# Patient Record
Sex: Female | Born: 1990 | Hispanic: Yes | Smoking: Never smoker
Health system: Southern US, Community
[De-identification: ages and names within clinical notes are randomized; demographics above are authoritative.]

## PROBLEM LIST (undated history)

## (undated) ENCOUNTER — Inpatient Hospital Stay: Payer: Self-pay

## (undated) DIAGNOSIS — G43909 Migraine, unspecified, not intractable, without status migrainosus: Secondary | ICD-10-CM

## (undated) DIAGNOSIS — F419 Anxiety disorder, unspecified: Secondary | ICD-10-CM

## (undated) DIAGNOSIS — O99210 Obesity complicating pregnancy, unspecified trimester: Secondary | ICD-10-CM

## (undated) DIAGNOSIS — J45909 Unspecified asthma, uncomplicated: Secondary | ICD-10-CM

## (undated) DIAGNOSIS — D649 Anemia, unspecified: Secondary | ICD-10-CM

## (undated) DIAGNOSIS — K219 Gastro-esophageal reflux disease without esophagitis: Secondary | ICD-10-CM

## (undated) HISTORY — PX: CHOLECYSTECTOMY: SHX55

## (undated) HISTORY — DX: Obesity complicating pregnancy, unspecified trimester: O99.210

## (undated) HISTORY — DX: Anxiety disorder, unspecified: F41.9

---

## 2012-04-03 DIAGNOSIS — O4290 Premature rupture of membranes, unspecified as to length of time between rupture and onset of labor, unspecified weeks of gestation: Secondary | ICD-10-CM

## 2013-11-05 ENCOUNTER — Emergency Department: Payer: Self-pay | Admitting: Emergency Medicine

## 2013-11-05 LAB — URINALYSIS, COMPLETE
BILIRUBIN, UR: NEGATIVE
Bacteria: NONE SEEN
GLUCOSE, UR: NEGATIVE mg/dL (ref 0–75)
KETONE: NEGATIVE
Nitrite: NEGATIVE
PH: 6 (ref 4.5–8.0)
Protein: NEGATIVE
SPECIFIC GRAVITY: 1.015 (ref 1.003–1.030)
Squamous Epithelial: 4
WBC UR: 4 /HPF (ref 0–5)

## 2013-11-05 LAB — COMPREHENSIVE METABOLIC PANEL
Albumin: 3.4 g/dL (ref 3.4–5.0)
Alkaline Phosphatase: 108 U/L
Anion Gap: 5 — ABNORMAL LOW (ref 7–16)
BUN: 14 mg/dL (ref 7–18)
Bilirubin,Total: 0.2 mg/dL (ref 0.2–1.0)
Calcium, Total: 9.2 mg/dL (ref 8.5–10.1)
Chloride: 107 mmol/L (ref 98–107)
Co2: 26 mmol/L (ref 21–32)
Creatinine: 0.95 mg/dL (ref 0.60–1.30)
EGFR (Non-African Amer.): >60
Glucose: 95 mg/dL (ref 65–99)
OSMOLALITY: 276 (ref 275–301)
POTASSIUM: 3.7 mmol/L (ref 3.5–5.1)
SGOT(AST): 22 U/L (ref 15–37)
SGPT (ALT): 29 U/L (ref 12–78)
Sodium: 138 mmol/L (ref 136–145)
TOTAL PROTEIN: 7.9 g/dL (ref 6.4–8.2)

## 2013-11-05 LAB — CBC
HCT: 37.5 % (ref 35.0–47.0)
HGB: 12.6 g/dL (ref 12.0–16.0)
MCH: 27.8 pg (ref 26.0–34.0)
MCHC: 33.7 g/dL (ref 32.0–36.0)
MCV: 83 fL (ref 80–100)
Platelet: 382 10*3/uL (ref 150–440)
RBC: 4.54 10*6/uL (ref 3.80–5.20)
RDW: 14.6 % — AB (ref 11.5–14.5)
WBC: 10.3 10*3/uL (ref 3.6–11.0)

## 2013-11-05 LAB — LIPASE, BLOOD: Lipase: 110 U/L (ref 73–393)

## 2013-11-05 LAB — HCG, QUANTITATIVE, PREGNANCY: Beta Hcg, Quant.: <1 m[IU]/mL — ABNORMAL LOW

## 2015-11-30 ENCOUNTER — Emergency Department
Admission: EM | Admit: 2015-11-30 | Discharge: 2015-11-30 | Disposition: A | Discharge status: Home / Self Care | Payer: Medicaid Other | Attending: Emergency Medicine | Admitting: Emergency Medicine

## 2015-11-30 DIAGNOSIS — R300 Dysuria: Secondary | ICD-10-CM | POA: Diagnosis not present

## 2015-11-30 DIAGNOSIS — R1031 Right lower quadrant pain: Secondary | ICD-10-CM | POA: Insufficient documentation

## 2015-11-30 LAB — URINALYSIS COMPLETE WITH MICROSCOPIC (ARMC ONLY)
Bilirubin Urine: NEGATIVE
Glucose, UA: NEGATIVE mg/dL
Ketones, ur: NEGATIVE mg/dL
Leukocytes, UA: NEGATIVE
Nitrite: NEGATIVE
PH: 5 (ref 5.0–8.0)
PROTEIN: NEGATIVE mg/dL
Specific Gravity, Urine: 1.019 (ref 1.005–1.030)

## 2015-11-30 LAB — POCT PREGNANCY, URINE: Preg Test, Ur: NEGATIVE

## 2015-11-30 LAB — PREGNANCY, URINE: Preg Test, Ur: NEGATIVE

## 2015-11-30 MED ORDER — PHENAZOPYRIDINE HCL 95 MG PO TABS: 95.0000 mg | ORAL_TABLET | Freq: Three times a day (TID) | ORAL | Status: DC | PRN

## 2015-11-30 MED ORDER — NAPROXEN 500 MG PO TABS: 500.0000 mg | ORAL_TABLET | Freq: Two times a day (BID) | ORAL | Status: DC

## 2015-11-30 NOTE — ED Notes (Signed)
Pt in with co suprapubic pain and urinary urgency and frequency.

## 2015-11-30 NOTE — Discharge Instructions (Signed)

## 2015-11-30 NOTE — ED Provider Notes (Signed)
[facility] Emergency Department Provider Note  ____________________________________________  Time seen: Approximately 7:39 PM  I have reviewed the triage vital signs and the nursing notes.   HISTORY  Chief Complaint Dysuria    HPI Candice Moore is a 25 y.o. female who presents emergency department complaining of right lower flank pain, suprapubic pain, dysuria, polyuria. Patient states that the symptoms have been intermittent in nature for the past 2 weeks and had increased in severity today. Patient denies any fevers or chills, nausea or vomiting, hematuria. Patient is not taking any medications prior to arrival. No history of kidney stones. No history of urinary tract infections.   No past medical history on file.  There are no active problems to display for this patient.   No past surgical history on file.  Current Outpatient Rx  Name  Route  Sig  Dispense  Refill  . naproxen (NAPROSYN) 500 MG tablet   Oral   Take 1 tablet (500 mg total) by mouth 2 (two) times daily with a meal.   60 tablet   0   . phenazopyridine (PYRIDIUM) 95 MG tablet   Oral   Take 1 tablet (95 mg total) by mouth 3 (three) times daily as needed for pain.   6 tablet   0     Allergies Review of patient's allergies indicates no known allergies.  No family history on file.  Social History Social History  Substance Use Topics  . Smoking status: Not on file  . Smokeless tobacco: Not on file  . Alcohol Use: Not on file     Review of Systems  Constitutional: No fever/chills Cardiovascular: no chest pain. Respiratory: no cough. No SOB. Gastrointestinal: No abdominal pain.  No nausea, no vomiting.  No diarrhea.  No constipation. Genitourinary: Positive for dysuria and polyuria.. No hematuria Musculoskeletal: Positive for right flank pain. Skin: Negative for rash. Neurological: Negative for headaches, focal weakness or numbness. 10-point ROS otherwise  negative.  ____________________________________________   PHYSICAL EXAM:  VITAL SIGNS: ED Triage Vitals  Enc Vitals Group     BP 11/30/15 1927 109/64 mmHg     Pulse Rate 11/30/15 1927 88     Resp 11/30/15 1927 18     Temp 11/30/15 1927 98.3 F (36.8 C)     Temp Source 11/30/15 1927 Oral     SpO2 11/30/15 1927 99 %     Weight 11/30/15 1927 224 lb (101.606 kg)     Height 11/30/15 1927  (1.575 m)     Head Cir --      Peak Flow --      Pain Score 11/30/15 1928 6     Pain Loc --      Pain Edu? --      Excl. in GC? --      Constitutional: Alert and oriented. Well appearing and in no acute distress. Eyes: Conjunctivae are normal. PERRL. EOMI. Head: Atraumatic. Cardiovascular: Normal rate, regular rhythm. Normal S1 and S2.  Good peripheral circulation. Respiratory: Normal respiratory effort without tachypnea or retractions. Lungs CTAB. Gastrointestinal: Bowel sounds 4 quadrants. Soft to palpation with no rigidity or guarding. Patient is tender to palpation in the suprapubic region but otherwise nontender to palpation.. No distention. No CVA tenderness. Neurologic:  Normal speech and language. No gross focal neurologic deficits are appreciated.  Skin:  Skin is warm, dry and intact. No rash noted. Psychiatric: Mood and affect are normal. Speech and behavior are normal. Patient exhibits appropriate insight and judgement.  ____________________________________________   LABS (all labs ordered are listed, but only abnormal results are displayed)  Labs Reviewed  URINALYSIS COMPLETEWITH MICROSCOPIC ([facility] ONLY) - Abnormal; Notable for the following:    Color, Urine YELLOW (*)    APPearance HAZY (*)    Hgb urine dipstick 1+ (*)    Bacteria, UA RARE (*)    Squamous Epithelial / LPF 0-5 (*)    All other components within normal limits  PREGNANCY, URINE  POCT PREGNANCY, URINE    ____________________________________________  EKG   ____________________________________________  RADIOLOGY   No results found.  ____________________________________________    PROCEDURES  Procedure(s) performed:       Medications - No data to display   ____________________________________________   INITIAL IMPRESSION / ASSESSMENT AND PLAN / ED COURSE  Pertinent labs & imaging results that were available during my care of the patient were reviewed by me and considered in my medical decision making (see chart for details).  Patient's diagnosis is consistent with dysuria. Upon further questioning patient states that she has just stopped the Mirena birth control method. She states that this would be the time that she should return to her periods. Patient states that the symptoms are similar to the last period that she had but just increased in nature. Urinalysis returns with no indication of urinary tract infection. There is minor hematuria noted. Patient is given the option to pursue CT scan at this time to evaluate for kidney stones. Patient declines at this time. Patient is given instructions to take prescribed medication as well as increased fluids to include cranberry juice for improvement of possible cystitis. Patient is also encouraged to use heating pad to relieve possible menstrual cramps. Patient will follow-up with specialist for any symptoms persisting past this treatment course.. Patient is given ED precautions to return to the ED for any worsening or new symptoms.     ____________________________________________  FINAL CLINICAL IMPRESSION(S) / ED DIAGNOSES  Final diagnoses:  Dysuria      NEW MEDICATIONS STARTED DURING THIS VISIT:  New Prescriptions   NAPROXEN (NAPROSYN) 500 MG TABLET    Take 1 tablet (500 mg total) by mouth 2 (two) times daily with a meal.   PHENAZOPYRIDINE (PYRIDIUM) 95 MG TABLET    Take 1 tablet (95 mg total) by mouth 3 (three)  times daily as needed for pain.        This chart was dictated using voice recognition software/Dragon. Despite best efforts to proofread, errors can occur which can change the meaning. Any change was purely unintentional.    Racheal PatchesJonathan D Yomar Mejorado, PA-C 11/30/15 2136  Jennye MoccasinBrian S Quigley, MD 11/30/15 2227

## 2016-04-04 ENCOUNTER — Other Ambulatory Visit: Payer: Self-pay | Admitting: Family Medicine

## 2016-04-04 DIAGNOSIS — R1030 Lower abdominal pain, unspecified: Secondary | ICD-10-CM

## 2016-04-04 DIAGNOSIS — Z3491 Encounter for supervision of normal pregnancy, unspecified, first trimester: Secondary | ICD-10-CM

## 2016-04-06 ENCOUNTER — Ambulatory Visit
Admission: RE | Admit: 2016-04-06 | Discharge: 2016-04-06 | Disposition: A | Discharge status: Home / Self Care | Payer: 59 | Source: Ambulatory Visit | Attending: Family Medicine | Admitting: Family Medicine

## 2016-04-06 DIAGNOSIS — Z3481 Encounter for supervision of other normal pregnancy, first trimester: Secondary | ICD-10-CM | POA: Diagnosis present

## 2016-04-06 DIAGNOSIS — Z3491 Encounter for supervision of normal pregnancy, unspecified, first trimester: Secondary | ICD-10-CM

## 2016-04-06 DIAGNOSIS — Z3A09 9 weeks gestation of pregnancy: Secondary | ICD-10-CM | POA: Insufficient documentation

## 2016-04-06 DIAGNOSIS — O3481 Maternal care for other abnormalities of pelvic organs, first trimester: Secondary | ICD-10-CM | POA: Diagnosis not present

## 2016-04-06 DIAGNOSIS — O26891 Other specified pregnancy related conditions, first trimester: Secondary | ICD-10-CM | POA: Diagnosis not present

## 2016-04-06 DIAGNOSIS — R1030 Lower abdominal pain, unspecified: Secondary | ICD-10-CM

## 2016-09-24 NOTE — L&D Delivery Note (Signed)
Operative Delivery Note At 1:31 PM a viable female was delivered via Vaginal, Spontaneous Delivery.  Presentation: vertex; Position: Occiput,, Anterior; Station: +5.  Delivery of the head: 11/02/2016  1:29 PM First maneuver: 11/02/2016  1:29 PM, McRoberts Second maneuver: 11/02/2016  1:30 PM, Suprapubic Pressure McRoberts Third maneuver: ,   Fourth maneuver: ,   Fifth maneuver: ,   Sixth maneuver: ,    Total time of dystocia was about 80 seconds by my count.   Verbal consent: obtained from patient.  APGAR: 6, 9; weight 9 lb 2 oz (4139 g).   Placenta status: delivered spontaneously, intact with 3VC.   Cord:  3VC with the following complications: .  Cord pH: not needed based an APGARs  Anesthesia:  None Episiotomy: None Lacerations: None Suture Repair: n/a Est. Blood Loss (mL): 350  Mom to postpartum.  Baby to Couplet care / Skin to Skin.  Called to see patient.  Mom pushed to deliver a viable female infant.  The head was somewhat delayed at delivery.  I performed a Ritgen maneuver to deliver the head.  The head rotated from OA to facing maternal right.  The usual, gentle traction was delivered to the fetal head without successful movement of the shoulders.  RN Chilton SiGreen and Ophelia CharterYates assisted with performing a McRoberts position, which did not affect delivery.  Suprapubic pressure was applied to the posterior right fetal shoulder.  With this maneuver the right shoulder delivered with gentle traction on the fetal head.  The posterior shoulder delivered easily.  The stomach was difficult to deliver, but with traction under the axillae, the fetal abdominal area was delivered and the rest of the body followed without issue.  The nursery staff was present at that time and the cord was quickly clamped and cut (allowed FOB to cut quickly).  Infant to peds staff.  Dr. Eric FormWimmer available immediately.  Infant with initial limited movement of right upper limb.  His APGARs were 6 and 9.  The nursery staff did not request  blood gases and they were not indicated from an obstetric standpoint.  Weight 4,139 grams. Placenta delivered spontaneously, intact, with a 3-vessel cord.  No vaginal, cervical, or perineal lacerations. All counts correct.  Hemostasis obtained with IV pitocin and fundal massage. EBL 350 mL.     Conard NovakJackson, Stephen D, MD 11/02/2016, 1:54 PM

## 2016-09-30 ENCOUNTER — Observation Stay
Admission: EM | Admit: 2016-09-30 | Discharge: 2016-09-30 | Disposition: A | Discharge status: Home / Self Care | Payer: 59 | Attending: Obstetrics and Gynecology | Admitting: Obstetrics and Gynecology

## 2016-09-30 ENCOUNTER — Encounter: Payer: Self-pay | Admitting: *Deleted

## 2016-09-30 DIAGNOSIS — N898 Other specified noninflammatory disorders of vagina: Secondary | ICD-10-CM | POA: Diagnosis present

## 2016-09-30 DIAGNOSIS — Z3A34 34 weeks gestation of pregnancy: Secondary | ICD-10-CM | POA: Diagnosis not present

## 2016-09-30 DIAGNOSIS — O26893 Other specified pregnancy related conditions, third trimester: Secondary | ICD-10-CM | POA: Diagnosis not present

## 2016-09-30 DIAGNOSIS — N949 Unspecified condition associated with female genital organs and menstrual cycle: Secondary | ICD-10-CM | POA: Diagnosis present

## 2016-09-30 HISTORY — DX: Gastro-esophageal reflux disease without esophagitis: K21.9

## 2016-09-30 NOTE — OB Triage Note (Signed)
Vaginal pain and pressure since Friday. Elaina HoopsElks, Lateef Juncaj S

## 2016-09-30 NOTE — Final Progress Note (Signed)
Physician Final Progress Note  Patient ID: Candice Moore MRN: [id number] DOB/AGE: 25/09/1990 25 y.o.  Admit date: 09/30/2016 Admitting provider: Vena AustriaAndreas Weyman Bogdon, MD Discharge date: 09/30/2016   Admission Diagnoses: Vaginal pressure  Discharge Diagnoses:  Active Problems:   Vaginal discomfort  26 yo G3P1011 at 358w2d presenting for vaginal pressure.  No contractions, no vaginal bleeding, +FM, no pain.  Denies dysuria  Consults: None  Significant Findings/ Diagnostic Studies: none  Procedures: NST 140, moderate variability, +accels, no decels.  Rare uterine contractins  Discharge Condition: good  Disposition: 01-Home or Self Care  Diet: Regular diet  Discharge Activity: Activity as tolerated  Discharge Instructions    Discharge activity:  No Restrictions    Complete by:  As directed    Fetal Kick Count:  Lie on our left side for one hour after a meal, and count the number of times your baby kicks.  If it is less than 5 times, get up, move around and drink some juice.  Repeat the test 30 minutes later.  If it is still less than 5 kicks in an hour, notify your doctor.    Complete by:  As directed    LABOR:  When conractions begin, you should start to time them from the beginning of one contraction to the beginning  of the next.  When contractions are 5 - 10 minutes apart or less and have been regular for at least an hour, you should call your health care provider.    Complete by:  As directed    No sexual activity restrictions    Complete by:  As directed    Notify physician for bleeding from the vagina    Complete by:  As directed    Notify physician for blurring of vision or spots before the eyes    Complete by:  As directed    Notify physician for chills or fever    Complete by:  As directed    Notify physician for fainting spells, "black outs" or loss of consciousness    Complete by:  As directed    Notify physician for increase in vaginal discharge    Complete by:  As  directed    Notify physician for leaking of fluid    Complete by:  As directed    Notify physician for pain or burning when urinating    Complete by:  As directed    Notify physician for pelvic pressure (sudden increase)    Complete by:  As directed    Notify physician for severe or continued nausea or vomiting    Complete by:  As directed    Notify physician for sudden gushing of fluid from the vagina (with or without continued leaking)    Complete by:  As directed    Notify physician for sudden, constant, or occasional abdominal pain    Complete by:  As directed    Notify physician if baby moving less than usual    Complete by:  As directed      Allergies as of 09/30/2016   No Known Allergies     Medication List    STOP taking these medications   phenazopyridine 95 MG tablet Commonly known as:  PYRIDIUM     TAKE these medications   multivitamin-prenatal 27-0.8 MG Tabs tablet Take 1 tablet by mouth daily at 12 noon.   ranitidine 150 MG capsule Commonly known as:  ZANTAC Take 150 mg by mouth 2 (two) times daily.  Total time spent taking care of this patient: 30 minutes  Signed: Lorrene Reid 09/30/2016, 7:10 PM

## 2016-10-30 ENCOUNTER — Inpatient Hospital Stay
Admission: EM | Admit: 2016-10-30 | Discharge: 2016-10-31 | Disposition: A | Discharge status: Home / Self Care | Payer: 59 | Source: Home / Self Care | Admitting: Obstetrics and Gynecology

## 2016-10-30 NOTE — OB Triage Note (Addendum)
Pt G3P1 7022w4d  complains of contractions every 5 minutes since 10/30/16 0000. States that she can barely walk through them. Rates them pain 10/10. Pt states leaking of fluid on toilet. Nitrazine was negative. Pt states feeling no fetal movement since 10/30/16 0000. Monitors applied and assessing. VSS.

## 2016-11-01 ENCOUNTER — Observation Stay
Admission: EM | Admit: 2016-11-01 | Discharge: 2016-11-02 | Disposition: A | Discharge status: Home / Self Care | Payer: 59 | Source: Home / Self Care | Admitting: Obstetrics and Gynecology

## 2016-11-01 DIAGNOSIS — O479 False labor, unspecified: Secondary | ICD-10-CM | POA: Diagnosis present

## 2016-11-01 MED ORDER — ACETAMINOPHEN 325 MG PO TABS: 650.0000 mg | ORAL_TABLET | ORAL | Status: DC | PRN

## 2016-11-01 NOTE — OB Triage Note (Signed)
Patient came in complaining of painful contractions all day, but states they became very painful "in the last few hours". Patient endorses fetal movement and denies LOF, vaginal bleeding. Monitors applied.

## 2016-11-02 ENCOUNTER — Inpatient Hospital Stay
Admission: EM | Admit: 2016-11-02 | Discharge: 2016-11-04 | DRG: 775 | Disposition: A | Discharge status: Home / Self Care | Payer: 59 | Attending: Obstetrics and Gynecology | Admitting: Obstetrics and Gynecology

## 2016-11-02 ENCOUNTER — Encounter: Payer: Self-pay | Admitting: Obstetrics and Gynecology

## 2016-11-02 DIAGNOSIS — Z3A39 39 weeks gestation of pregnancy: Secondary | ICD-10-CM

## 2016-11-02 DIAGNOSIS — J45909 Unspecified asthma, uncomplicated: Secondary | ICD-10-CM | POA: Diagnosis present

## 2016-11-02 DIAGNOSIS — O9952 Diseases of the respiratory system complicating childbirth: Secondary | ICD-10-CM | POA: Diagnosis present

## 2016-11-02 DIAGNOSIS — O99824 Streptococcus B carrier state complicating childbirth: Secondary | ICD-10-CM | POA: Diagnosis present

## 2016-11-02 DIAGNOSIS — K219 Gastro-esophageal reflux disease without esophagitis: Secondary | ICD-10-CM | POA: Diagnosis present

## 2016-11-02 DIAGNOSIS — O99214 Obesity complicating childbirth: Secondary | ICD-10-CM | POA: Diagnosis present

## 2016-11-02 DIAGNOSIS — Z3493 Encounter for supervision of normal pregnancy, unspecified, third trimester: Secondary | ICD-10-CM | POA: Diagnosis present

## 2016-11-02 DIAGNOSIS — E669 Obesity, unspecified: Secondary | ICD-10-CM | POA: Diagnosis present

## 2016-11-02 DIAGNOSIS — O9962 Diseases of the digestive system complicating childbirth: Secondary | ICD-10-CM | POA: Diagnosis present

## 2016-11-02 DIAGNOSIS — Z6841 Body Mass Index (BMI) 40.0 and over, adult: Secondary | ICD-10-CM

## 2016-11-02 HISTORY — DX: Unspecified asthma, uncomplicated: J45.909

## 2016-11-02 LAB — TYPE AND SCREEN
ABO/RH(D): O POS
Antibody Screen: NEGATIVE

## 2016-11-02 LAB — CBC
HEMATOCRIT: 35.8 % (ref 35.0–47.0)
Hemoglobin: 12.2 g/dL (ref 12.0–16.0)
MCH: 26.7 pg (ref 26.0–34.0)
MCHC: 34.2 g/dL (ref 32.0–36.0)
MCV: 78 fL — AB (ref 80.0–100.0)
PLATELETS: 337 10*3/uL (ref 150–440)
RBC: 4.59 MIL/uL (ref 3.80–5.20)
RDW: 15.5 % — ABNORMAL HIGH (ref 11.5–14.5)
WBC: 10.8 10*3/uL (ref 3.6–11.0)

## 2016-11-02 LAB — SAMPLE TO BLOOD BANK

## 2016-11-02 MED ORDER — ONDANSETRON HCL 4 MG/2ML IJ SOLN: 4.0000 mg | INTRAMUSCULAR | Status: DC | PRN

## 2016-11-02 MED ORDER — ONDANSETRON HCL 4 MG/2ML IJ SOLN: 4.0000 mg | Freq: Four times a day (QID) | INTRAMUSCULAR | Status: DC | PRN

## 2016-11-02 MED ORDER — FENTANYL CITRATE (PF) 100 MCG/2ML IJ SOLN
50.0000 ug | Freq: Once | INTRAMUSCULAR | Status: AC
  Administered 2016-11-02: 50 ug via INTRAVENOUS

## 2016-11-02 MED ORDER — DIPHENHYDRAMINE HCL 25 MG PO CAPS: 25.0000 mg | ORAL_CAPSULE | Freq: Four times a day (QID) | ORAL | Status: DC | PRN

## 2016-11-02 MED ORDER — WITCH HAZEL-GLYCERIN EX PADS: 1.0000 "application " | MEDICATED_PAD | CUTANEOUS | Status: DC | PRN

## 2016-11-02 MED ORDER — BENZOCAINE-MENTHOL 20-0.5 % EX AERO
1.0000 "application " | INHALATION_SPRAY | CUTANEOUS | Status: DC | PRN
  Filled 2016-11-02: qty 56

## 2016-11-02 MED ORDER — OXYTOCIN 40 UNITS IN LACTATED RINGERS INFUSION - SIMPLE MED
2.5000 [IU]/h | INTRAVENOUS | Status: DC
  Filled 2016-11-02: qty 1000

## 2016-11-02 MED ORDER — LACTATED RINGERS IV SOLN: 500.0000 mL | INTRAVENOUS | Status: DC | PRN

## 2016-11-02 MED ORDER — SIMETHICONE 80 MG PO CHEW: 80.0000 mg | CHEWABLE_TABLET | ORAL | Status: DC | PRN

## 2016-11-02 MED ORDER — HYDROCODONE-ACETAMINOPHEN 5-325 MG PO TABS
1.0000 | ORAL_TABLET | Freq: Four times a day (QID) | ORAL | Status: DC | PRN
  Administered 2016-11-03 – 2016-11-04 (×3): 1 via ORAL
  Filled 2016-11-02 (×3): qty 1

## 2016-11-02 MED ORDER — PRENATAL MULTIVITAMIN CH
1.0000 | ORAL_TABLET | Freq: Every day | ORAL | Status: DC
  Administered 2016-11-03 – 2016-11-04 (×2): 1 via ORAL
  Filled 2016-11-02 (×2): qty 1

## 2016-11-02 MED ORDER — FERROUS SULFATE 325 (65 FE) MG PO TABS
325.0000 mg | ORAL_TABLET | Freq: Two times a day (BID) | ORAL | Status: DC
  Administered 2016-11-02 – 2016-11-04 (×4): 325 mg via ORAL
  Filled 2016-11-02 (×4): qty 1

## 2016-11-02 MED ORDER — ONDANSETRON HCL 4 MG PO TABS: 4.0000 mg | ORAL_TABLET | ORAL | Status: DC | PRN

## 2016-11-02 MED ORDER — OXYTOCIN BOLUS FROM INFUSION
500.0000 mL | Freq: Once | INTRAVENOUS | Status: DC
  Administered 2016-11-02: 500 mL via INTRAVENOUS

## 2016-11-02 MED ORDER — IBUPROFEN 600 MG PO TABS
600.0000 mg | ORAL_TABLET | Freq: Four times a day (QID) | ORAL | Status: DC
  Administered 2016-11-02 – 2016-11-04 (×8): 600 mg via ORAL
  Filled 2016-11-02 (×10): qty 1

## 2016-11-02 MED ORDER — FENTANYL CITRATE (PF) 100 MCG/2ML IJ SOLN
50.0000 ug | INTRAMUSCULAR | Status: AC
  Administered 2016-11-02: 50 ug via INTRAVENOUS

## 2016-11-02 MED ORDER — LACTATED RINGERS IV SOLN
INTRAVENOUS | Status: DC
  Administered 2016-11-02: 11:00:00 via INTRAVENOUS

## 2016-11-02 MED ORDER — COCONUT OIL OIL
1.0000 "application " | TOPICAL_OIL | Status: DC | PRN
  Administered 2016-11-02: 1 via TOPICAL

## 2016-11-02 MED ORDER — SENNOSIDES-DOCUSATE SODIUM 8.6-50 MG PO TABS
2.0000 | ORAL_TABLET | ORAL | Status: DC
  Administered 2016-11-02: 2 via ORAL
  Filled 2016-11-02 (×2): qty 2

## 2016-11-02 MED ORDER — ACETAMINOPHEN 325 MG PO TABS: 650.0000 mg | ORAL_TABLET | ORAL | Status: DC | PRN

## 2016-11-02 MED ORDER — LIDOCAINE HCL (PF) 1 % IJ SOLN
30.0000 mL | INTRAMUSCULAR | Status: DC | PRN
  Filled 2016-11-02: qty 30

## 2016-11-02 MED ORDER — DIBUCAINE 1 % RE OINT: 1.0000 "application " | TOPICAL_OINTMENT | RECTAL | Status: DC | PRN

## 2016-11-02 MED ORDER — FENTANYL CITRATE (PF) 100 MCG/2ML IJ SOLN
INTRAMUSCULAR | Status: AC
  Administered 2016-11-02: 50 ug via INTRAVENOUS
  Filled 2016-11-02: qty 2

## 2016-11-02 MED ORDER — SOD CITRATE-CITRIC ACID 500-334 MG/5ML PO SOLN: 30.0000 mL | ORAL | Status: DC | PRN

## 2016-11-02 MED ORDER — SODIUM CHLORIDE 0.9 % IV SOLN
2.0000 g | Freq: Once | INTRAVENOUS | Status: AC
  Administered 2016-11-02: 2 g via INTRAVENOUS
  Filled 2016-11-02: qty 2000

## 2016-11-02 MED ORDER — MISOPROSTOL 200 MCG PO TABS
ORAL_TABLET | ORAL | Status: AC
  Filled 2016-11-02: qty 4

## 2016-11-02 MED ORDER — OXYTOCIN 10 UNIT/ML IJ SOLN: 10.0000 [IU] | Freq: Once | INTRAMUSCULAR | Status: DC

## 2016-11-02 NOTE — Discharge Summary (Signed)
OB Discharge Summary     Patient Name: Candice Moore DOB: 07/02/1991 MRN: [id number]  Date of admission: 11/02/2016 Delivering MD: Conard NovakJackson, Stephen D, MD  Date of Delivery: 11/02/2016  Date of discharge: 11/04/2016  Admitting diagnosis: 39 wks preg Intrauterine pregnancy: 2228w0d     Secondary diagnosis: None     Discharge diagnosis: Term Pregnancy Delivered                                                                                                Post partum procedures:none  Augmentation: none  Complications: None  Hospital course:  Onset of Labor With Vaginal Delivery     26 y.o. yo [id number] at 6328w0d was admitted in Active Labor on 11/02/2016. Patient had an uncomplicated labor course as follows:  Membrane Rupture Time/Date:   ,    Intrapartum Procedures: Episiotomy: None [1]                                         Lacerations:  None [1]  Patient had a delivery of a Viable infant. 11/02/2016  Information for the patient's newborn:  Jonna ClarkLinares, Boy Breuna [[id number]][[id number]]  Delivery Method: Vag-Spont Note that the patient had a shoulder dystocia. See delivery note for details.     Pateint had an uncomplicated postpartum course.  She is ambulating, tolerating a regular diet, passing flatus, and urinating well. Patient is discharged home in stable condition on 11/04/16.   Physical exam  Vitals:   11/03/16 1208 11/03/16 1617 11/03/16 1939 11/04/16 0756  BP: (!) 108/56  (!) 114/51 (!) 106/56  Pulse: 71  97 69  Resp: 18  20   Temp: 97.5 F (36.4 C) 98.6 F (37 C) 98.4 F (36.9 C) 97.9 F (36.6 C)  TempSrc: Oral Oral Oral Oral  SpO2: 100%  97% 100%  Weight:      Height:       General: alert, cooperative and no distress Lochia: appropriate Uterine Fundus: firm Incision: N/A DVT Evaluation: No evidence of DVT seen on physical exam. No cords or calf tenderness. No significant calf/ankle edema.  Labs: Lab Results  Component Value Date   WBC 12.9 (H) 11/03/2016   HGB 11.0 (L)  11/03/2016   HCT 33.1 (L) 11/03/2016   MCV 79.8 (L) 11/03/2016   PLT 320 11/03/2016   CMP Latest Ref Rng & Units 11/05/2013  Glucose 65 - 99 mg/dL 95  BUN 7 - 18 mg/dL 14  Creatinine 1.300.60 - 8.651.30 mg/dL 7.840.95  Sodium 696136 - 295145 mmol/L 138  Potassium 3.5 - 5.1 mmol/L 3.7  Chloride 98 - 107 mmol/L 107  CO2 21 - 32 mmol/L 26  Calcium 8.5 - 10.1 mg/dL 9.2  Total Protein 6.4 - 8.2 g/dL 7.9  Total Bilirubin 0.2 - 1.0 mg/dL 0.2  Alkaline Phos Unit/L 108  AST 15 - 37 Unit/L 22  ALT 12 - 78 U/L 29    Discharge instruction: per After Visit Summary.  Medications:  Allergies as of 11/04/2016   No Known  Allergies    Take prenatal vitamin daily  Diet: routine diet  Activity: Advance as tolerated. Pelvic rest for 6 weeks.   Outpatient follow up: Follow-up Information    Conard Novak, MD. Schedule an appointment as soon as possible for a visit in 6 week(s).   Specialty:  Obstetrics and Gynecology Contact information: [address] [place] [address] [phone]             Postpartum contraception: Depo Provera 1st injection at hospital and will need Rx for 3 additional Rhogam Given postpartum: no Rubella vaccine given postpartum: no Varicella vaccine given postpartum: no TDaP given antepartum or postpartum: AP on 09/19/16  Newborn Data: Live born female  Birth Weight: 9 lb 2 oz (4139 g) APGAR: 6, 9    Baby Feeding: Breast  Disposition:home with mother  SIGNED: Tresea Mall, CNM

## 2016-11-02 NOTE — Progress Notes (Signed)
Went into pt's room to assess status. sve = very small anterior lip. Dr Jean Rosenthaljackson notified. Pt screaming. Pt instructed how to push.

## 2016-11-02 NOTE — Progress Notes (Signed)
Unable to keep continuous fetal heart rate on baby due to rapid progress and pt moving

## 2016-11-02 NOTE — Progress Notes (Signed)
Assistance callked due to possible shoulder dystocia

## 2016-11-02 NOTE — Final Progress Note (Signed)
Physician Final Progress Note  Patient ID: Candice Moore MRN: [id number] DOB/AGE: [birth year] 25 y.o.  Admit date: 11/01/2016 Admitting provider: Vena Austria, MD Discharge date: 11/02/2016   Admission Diagnoses: Irregular contractions  Discharge Diagnoses:  Active Problems:   Irregular contractions  25 yo G3P1011 at [redacted]w[redacted]d presenting with irregular uterine contractions. No cervical change from last check and no cervical change over 2-hrs.  +FM, no LOF, no VB.  Discharge with routine labor precautions.  Consults: None  Significant Findings/ Diagnostic Studies: none  Procedures: NST reactive 140, moderate, +accels, no decels  Discharge Condition: good  Disposition: 01-Home or Self Care  Diet: Regular diet  Discharge Activity: Activity as tolerated  Discharge Instructions    Discharge activity:  No Restrictions    Complete by:  As directed    Discharge diet:  No restrictions    Complete by:  As directed    Fetal Kick Count:  Lie on our left side for one hour after a meal, and count the number of times your baby kicks.  If it is less than 5 times, get up, move around and drink some juice.  Repeat the test 30 minutes later.  If it is still less than 5 kicks in an hour, notify your doctor.    Complete by:  As directed    LABOR:  When conractions begin, you should start to time them from the beginning of one contraction to the beginning  of the next.  When contractions are 5 - 10 minutes apart or less and have been regular for at least an hour, you should call your health care provider.    Complete by:  As directed    No sexual activity restrictions    Complete by:  As directed    Notify physician for bleeding from the vagina    Complete by:  As directed    Notify physician for blurring of vision or spots before the eyes    Complete by:  As directed    Notify physician for chills or fever    Complete by:  As directed    Notify physician for fainting spells, "black outs" or  loss of consciousness    Complete by:  As directed    Notify physician for increase in vaginal discharge    Complete by:  As directed    Notify physician for leaking of fluid    Complete by:  As directed    Notify physician for pain or burning when urinating    Complete by:  As directed    Notify physician for pelvic pressure (sudden increase)    Complete by:  As directed    Notify physician for severe or continued nausea or vomiting    Complete by:  As directed    Notify physician for sudden gushing of fluid from the vagina (with or without continued leaking)    Complete by:  As directed    Notify physician for sudden, constant, or occasional abdominal pain    Complete by:  As directed    Notify physician if baby moving less than usual    Complete by:  As directed      Allergies as of 11/02/2016   No Known Allergies     Medication List    TAKE these medications   multivitamin-prenatal 27-0.8 MG Tabs tablet Take 1 tablet by mouth daily at 12 noon.   ranitidine 150 MG capsule Commonly known as:  ZANTAC Take 150 mg by mouth 2 (two) times daily.  Total time spent taking care of this patient: 20 minutes  Signed: Lorrene ReidSTAEBLER, Kajuana Shareef M 11/02/2016, 1:53 AM

## 2016-11-02 NOTE — H&P (Signed)
OB History & Physical   History of Present Illness:  Chief Complaint: contractions  HPI:  Candice Moore is a 26 y.o. G21P1011 female at [redacted]w[redacted]d dated by 9 week ultrasound.  Her pregnancy has been complicated by obesity with BMI >40.    She reports contractions.   She denies leakage of fluid.   She denies vaginal bleeding.   She reports fetal movement.    O positive / Rubella Immune / Varicella Immune / RPR NR / HBsAg negative / HIV negative GBS positive / TDaP yes, date: 09/19/16 / flu no / 1st trim screen negative / 29 pound weight gain Last growth scan 2,843 grams on 10/04/16.  EFW today ~8 pounds.  Maternal Medical History:   Past Medical History:  Diagnosis Date  . Asthma   . GERD (gastroesophageal reflux disease)     Past Surgical History:  Procedure Laterality Date  . CHOLECYSTECTOMY      No Known Allergies  Prior to Admission medications   Medication Sig Start Date End Date Taking? Authorizing Provider  Prenatal Vit-Fe Fumarate-FA (MULTIVITAMIN-PRENATAL) 27-0.8 MG TABS tablet Take 1 tablet by mouth daily at 12 noon.    Historical Provider, MD  ranitidine (ZANTAC) 150 MG capsule Take 150 mg by mouth 2 (two) times daily.    Historical Provider, MD    OB History  Gravida Para Term Preterm AB Living  3 1 1  0 1 1  SAB TAB Ectopic Multiple Live Births  0 1 0 0 1    # Outcome Date GA Lbr Len/2nd Weight Sex Delivery Anes PTL Lv  3 Current           2 TAB           1 Term               Prenatal care site: [facility]  Social History: She  reports that she has never smoked. She has never used smokeless tobacco. She reports that she does not drink alcohol or use drugs.  Family History: family history includes Breast cancer in her maternal grandmother; Colon cancer in her maternal grandfather.   Review of Systems: Negative x 10 systems reviewed except as noted in the HPI.    Physical Exam:  Vital Signs: BP 131/68 (BP Location: Left Arm)   Pulse 80   Temp 98.1  F (36.7 C) (Oral)   Resp 18   LMP 01/29/2016  Constitutional: Well nourished, well developed female in no acute distress.  HEENT: normal Skin: Warm and dry.  Cardiovascular: Regular rate and rhythm.   Extremity: no edema Respiratory: Clear to auscultation bilateral. Normal respiratory effort Abdomen: gravid, NT Back: no CVAT Neuro: DTRs 2+, Cranial nerves grossly intact Psych: Alert and Oriented x3. No memory deficits. Normal mood and affect.  MS: normal gait, normal bilateral lower extremity ROM/strength/stability.  Pelvic exam: 5cm per RN (last exam was 3 cm)  Pertinent Results:  Prenatal Labs: Blood type/Rh O positive  Antibody screen negative  Rubella Immune  Varicella Immune    RPR NR  HBsAg negative  HIV negative  GC negative  Chlamydia negative  Genetic screening 1st trim screen neg.no msAFP  1 hour GTT Early 97, 96 at 28 wks  3 hour GTT n/a  GBS positive    Baseline FHR: 150 beats/min   Variability: moderate   Accelerations: absent   Decelerations: absent Contractions: present frequency: 3-4 q 10 min Overall assessment: 1 (negative CST)  Assessment:  Candice Moore is a 26 y.o.  [id number] female at 7473w0d with normal labor.   Plan:  1. Admit to Labor & Delivery  2. CBC, T&S, Clrs, IVF 3. GBS positive : ampicillin 4. Fetwal well-being: reassuring overall   Conard NovakJackson, Conor Lata D, MD 11/02/2016 10:39 AM

## 2016-11-02 NOTE — Progress Notes (Signed)
Pt up to bathroom. Voided large amount. pericare given. Waiting on bath for baby. Pt sitting  In wheelchair waiting on transfer

## 2016-11-02 NOTE — Discharge Instructions (Signed)
Drink plenty of fluids, rest frequently.  °

## 2016-11-02 NOTE — Procedures (Signed)
Kelly yates at bedside and suprapubic pressure coordinated with dr Jean Rosenthaljackson.

## 2016-11-02 NOTE — Progress Notes (Signed)
Cervix unchanged. Discharge instructions reviewed with patient and significant other. Patient and significant other deny questions at this time, ambulatory off unit in stable condition.

## 2016-11-02 NOTE — Progress Notes (Signed)
Vaginal delivery of viable female infant. Delayed cord clamping x 30 sec, then taken to warmer with newborn personnel  At warmer

## 2016-11-03 LAB — CBC
HEMATOCRIT: 33.1 % — AB (ref 35.0–47.0)
Hemoglobin: 11 g/dL — ABNORMAL LOW (ref 12.0–16.0)
MCH: 26.5 pg (ref 26.0–34.0)
MCHC: 33.2 g/dL (ref 32.0–36.0)
MCV: 79.8 fL — AB (ref 80.0–100.0)
Platelets: 320 10*3/uL (ref 150–440)
RBC: 4.15 MIL/uL (ref 3.80–5.20)
RDW: 15.4 % — AB (ref 11.5–14.5)
WBC: 12.9 10*3/uL — ABNORMAL HIGH (ref 3.6–11.0)

## 2016-11-03 LAB — RPR: RPR: NONREACTIVE

## 2016-11-03 NOTE — Progress Notes (Signed)
Post Partum Day 1 Subjective: Doing well, no complaints.  Tolerating regular diet, pain with PO meds, voiding and ambulating without difficulty.  No CP SOB F/C N/V or leg pain No HA, change of vision, RUQ/epigastric pain  Objective: BP (!) 115/51 (BP Location: Right Arm)   Pulse 85   Temp 97.8 F (36.6 C) (Oral)   Resp 17   Ht 5\' 2"  (1.575 m)   Wt 248 lb (112.5 kg)   LMP 01/29/2016   SpO2 99%   Breastfeeding   BMI 45.36 kg/m    Physical Exam:  General: NAD CV: RRR Pulm: nl effort, CTABL Lochia: moderate Uterine Fundus: fundus firm and below umbilicus DVT Evaluation: no cords, ttp LEs    Recent Labs  11/02/16 1134 11/03/16 0554  HGB 12.2 11.0*  HCT 35.8 33.1*  WBC 10.8 12.9*  PLT 337 320    Assessment/Plan: 25 y.o. G3P2011 postpartum day # 1  1. Continue routine postpartum care 2. O positive, Rubella Immune, Varicella Immune 3. TDAP UTD 4. Breastfeeding/Contraception: Depo Provera 5. Disposition: Discharge to home tomorrow   Tresea MallGLEDHILL,Harrell Niehoff, CNM

## 2016-11-04 MED ORDER — MEDROXYPROGESTERONE ACETATE 150 MG/ML IM SUSP
150.0000 mg | Freq: Once | INTRAMUSCULAR | Status: AC
  Administered 2016-11-04: 150 mg via INTRAMUSCULAR
  Filled 2016-11-04: qty 1

## 2016-11-04 MED ORDER — INFLUENZA VAC SPLIT QUAD 0.5 ML IM SUSY
0.5000 mL | PREFILLED_SYRINGE | INTRAMUSCULAR | Status: DC
  Filled 2016-11-04: qty 0.5

## 2016-11-27 NOTE — Progress Notes (Signed)
FMLA/DISABILITY form amended to reflect pt be out 10/15/16 to 10/31/16.

## 2016-12-17 ENCOUNTER — Encounter: Payer: Self-pay | Admitting: Obstetrics and Gynecology

## 2017-01-28 ENCOUNTER — Other Ambulatory Visit: Payer: Self-pay | Admitting: Advanced Practice Midwife

## 2017-01-28 ENCOUNTER — Ambulatory Visit: Payer: 59

## 2017-01-28 ENCOUNTER — Telehealth: Payer: Self-pay | Admitting: Advanced Practice Midwife

## 2017-01-28 DIAGNOSIS — Z3042 Encounter for surveillance of injectable contraceptive: Secondary | ICD-10-CM

## 2017-01-28 MED ORDER — MEDROXYPROGESTERONE ACETATE 150 MG/ML IM SUSP: 150.0000 mg | INTRAMUSCULAR | 3 refills | Status: DC

## 2017-01-28 NOTE — Telephone Encounter (Signed)
Patient needs depo refill sent in, [facility] [address], scheduled 5/10.

## 2017-01-28 NOTE — Telephone Encounter (Signed)
Rx sent to pharmacy and patient is aware 

## 2017-01-31 ENCOUNTER — Ambulatory Visit (INDEPENDENT_AMBULATORY_CARE_PROVIDER_SITE_OTHER): Payer: 59

## 2017-01-31 DIAGNOSIS — Z3042 Encounter for surveillance of injectable contraceptive: Secondary | ICD-10-CM | POA: Diagnosis not present

## 2017-01-31 MED ORDER — MEDROXYPROGESTERONE ACETATE 150 MG/ML IM SUSP
150.0000 mg | Freq: Once | INTRAMUSCULAR | Status: AC
  Administered 2017-01-31: 150 mg via INTRAMUSCULAR

## 2017-04-30 ENCOUNTER — Telehealth: Payer: Self-pay | Admitting: Obstetrics and Gynecology

## 2017-04-30 ENCOUNTER — Ambulatory Visit: Payer: 59

## 2017-04-30 NOTE — Telephone Encounter (Signed)
Pt is schedule today 04/30/17 for depo injection. Pt reports she is out of refills please advise. [facility] [address].

## 2017-04-30 NOTE — Telephone Encounter (Signed)
Called and left voicemail for patient to call back to be schedule for annual before the provider will refill her injection.

## 2017-05-01 ENCOUNTER — Ambulatory Visit: Payer: 59

## 2017-05-03 ENCOUNTER — Ambulatory Visit (INDEPENDENT_AMBULATORY_CARE_PROVIDER_SITE_OTHER): Payer: 59

## 2017-05-03 DIAGNOSIS — Z3042 Encounter for surveillance of injectable contraceptive: Secondary | ICD-10-CM

## 2017-05-03 MED ORDER — MEDROXYPROGESTERONE ACETATE 150 MG/ML IM SUSP
150.0000 mg | Freq: Once | INTRAMUSCULAR | Status: AC
  Administered 2017-05-03: 150 mg via INTRAMUSCULAR

## 2017-07-26 ENCOUNTER — Ambulatory Visit (INDEPENDENT_AMBULATORY_CARE_PROVIDER_SITE_OTHER): Payer: 59

## 2017-07-26 ENCOUNTER — Telehealth: Payer: Self-pay

## 2017-07-26 DIAGNOSIS — Z308 Encounter for other contraceptive management: Secondary | ICD-10-CM

## 2017-07-26 MED ORDER — MEDROXYPROGESTERONE ACETATE 150 MG/ML IM SUSP
150.0000 mg | Freq: Once | INTRAMUSCULAR | Status: AC
  Administered 2017-07-26: 150 mg via INTRAMUSCULAR

## 2017-07-26 NOTE — Progress Notes (Signed)
Pt here for depo which was given IM left deltoid. NDC# [address]-2 

## 2017-07-26 NOTE — Telephone Encounter (Signed)
Pt has appt at 4pm today for depo, has not way to go to pharm to request refill.  Wants to know if we can call it in so she can just p/u.  [phone]  Left detailed msg that she has refills thru 01/2018, to call pharm and have them get it ready for her, # given.

## 2017-10-11 ENCOUNTER — Ambulatory Visit: Payer: 59

## 2017-10-15 ENCOUNTER — Ambulatory Visit (INDEPENDENT_AMBULATORY_CARE_PROVIDER_SITE_OTHER): Payer: 59

## 2017-10-15 DIAGNOSIS — Z3042 Encounter for surveillance of injectable contraceptive: Secondary | ICD-10-CM

## 2017-10-15 DIAGNOSIS — Z309 Encounter for contraceptive management, unspecified: Secondary | ICD-10-CM

## 2017-10-15 MED ORDER — MEDROXYPROGESTERONE ACETATE 150 MG/ML IM SUSP
150.0000 mg | Freq: Once | INTRAMUSCULAR | Status: AC
  Administered 2017-10-15: 150 mg via INTRAMUSCULAR

## 2018-01-07 ENCOUNTER — Ambulatory Visit: Payer: Medicaid Other

## 2019-04-23 ENCOUNTER — Emergency Department: Payer: 59

## 2019-04-23 ENCOUNTER — Encounter: Payer: Self-pay | Admitting: Emergency Medicine

## 2019-04-23 ENCOUNTER — Other Ambulatory Visit: Payer: Self-pay

## 2019-04-23 ENCOUNTER — Emergency Department
Admission: EM | Admit: 2019-04-23 | Discharge: 2019-04-23 | Disposition: A | Discharge status: Home / Self Care | Payer: 59 | Attending: Emergency Medicine | Admitting: Emergency Medicine

## 2019-04-23 DIAGNOSIS — Z9049 Acquired absence of other specified parts of digestive tract: Secondary | ICD-10-CM | POA: Insufficient documentation

## 2019-04-23 DIAGNOSIS — Z79899 Other long term (current) drug therapy: Secondary | ICD-10-CM | POA: Diagnosis not present

## 2019-04-23 DIAGNOSIS — R509 Fever, unspecified: Secondary | ICD-10-CM | POA: Diagnosis present

## 2019-04-23 DIAGNOSIS — J45909 Unspecified asthma, uncomplicated: Secondary | ICD-10-CM | POA: Insufficient documentation

## 2019-04-23 DIAGNOSIS — U071 COVID-19: Secondary | ICD-10-CM | POA: Insufficient documentation

## 2019-04-23 DIAGNOSIS — J020 Streptococcal pharyngitis: Secondary | ICD-10-CM | POA: Insufficient documentation

## 2019-04-23 DIAGNOSIS — J069 Acute upper respiratory infection, unspecified: Secondary | ICD-10-CM

## 2019-04-23 LAB — SARS CORONAVIRUS 2 BY RT PCR (HOSPITAL ORDER, PERFORMED IN ~~LOC~~ HOSPITAL LAB): SARS Coronavirus 2: POSITIVE — AB

## 2019-04-23 LAB — GROUP A STREP BY PCR: Group A Strep by PCR: DETECTED — AB

## 2019-04-23 MED ORDER — AMOXICILLIN 500 MG PO CAPS: 500.0000 mg | ORAL_CAPSULE | Freq: Three times a day (TID) | ORAL | 0 refills | Status: DC

## 2019-04-23 MED ORDER — IBUPROFEN 600 MG PO TABS
600.0000 mg | ORAL_TABLET | Freq: Once | ORAL | Status: AC
  Administered 2019-04-23: 600 mg via ORAL
  Filled 2019-04-23: qty 1

## 2019-04-23 MED ORDER — GUAIFENESIN-CODEINE 100-6.3 MG/5ML PO SOLN: 5.0000 mL | Freq: Four times a day (QID) | ORAL | 0 refills | Status: DC

## 2019-04-23 MED ORDER — IBUPROFEN 600 MG PO TABS: 600.0000 mg | ORAL_TABLET | Freq: Three times a day (TID) | ORAL | 0 refills | Status: DC | PRN

## 2019-04-23 NOTE — Discharge Instructions (Addendum)
Take medication as directed.  Advised of quarantine the results of cover test is made available.  If test is negative you may return back to work in 2 days.

## 2019-04-23 NOTE — ED Provider Notes (Signed)
[facility] Emergency Department Provider Note   ____________________________________________   First MD Initiated Contact with Patient 04/23/19 1401     (approximate)  I have reviewed the triage vital signs and the nursing notes.   HISTORY  Chief Complaint Cough, Headache, Fever, Emesis, and Chest Pain    HPI Candice Moore is a 28 y.o. female patient presents with fever, sore throat, productive cough, and body aches.  Patient also complain of increased fatigue but admits to extended hours at work.  Patient had appointment for MD at 2:00.  Told her to come to the ED for evaluation for COVID.  Patient rates her overall pain a 7/10.  Patient scribed pain is "achy".  No palliative measure for complaint.         Past Medical History:  Diagnosis Date  . Asthma   . GERD (gastroesophageal reflux disease)     Patient Active Problem List   Diagnosis Date Noted  . Postpartum care following vaginal delivery 11/04/2016  . Normal labor 11/02/2016    Past Surgical History:  Procedure Laterality Date  . CHOLECYSTECTOMY      Prior to Admission medications   Medication Sig Start Date End Date Taking? Authorizing Provider  amoxicillin (AMOXIL) 500 MG capsule Take 1 capsule (500 mg total) by mouth 3 (three) times daily. 04/23/19   Joni ReiningSmith,  K, PA-C  guaiFENesin-Codeine 100-6.3 MG/5ML SOLN Take 5 mLs by mouth 4 (four) times daily. 04/23/19   Joni ReiningSmith,  K, PA-C  ibuprofen (ADVIL) 600 MG tablet Take 1 tablet (600 mg total) by mouth every 8 (eight) hours as needed. 04/23/19   Joni ReiningSmith,  K, PA-C  medroxyPROGESTERone (DEPO-PROVERA) 150 MG/ML injection Inject 1 mL (150 mg total) into the muscle every 3 (three) months. 01/28/17 01/23/18  Tresea MallGledhill, Jane, CNM  Prenatal Vit-Fe Fumarate-FA (MULTIVITAMIN-PRENATAL) 27-0.8 MG TABS tablet Take 1 tablet by mouth daily at 12 noon.    [provider]    Allergies Patient has no known allergies.  Family History   Problem Relation Age of Onset  . Breast cancer Maternal Grandmother   . Colon cancer Maternal Grandfather     Social History Social History   Tobacco Use  . Smoking status: Never Smoker  . Smokeless tobacco: Never Used  Substance Use Topics  . Alcohol use: No  . Drug use: No    Review of Systems Constitutional: Fever/chills.  Body aches.   Eyes: No visual changes. ENT: Sore throat.. Cardiovascular: Denies chest pain. Respiratory: Productive cough.  Chest wall pain with respiration. Gastrointestinal: No abdominal pain.  No nausea, no vomiting.  No diarrhea.  No constipation. Genitourinary: Negative for dysuria. Musculoskeletal: Chest wall pain. Skin: Negative for rash. Neurological: Negative for headaches, focal weakness or numbness.   ____________________________________________   PHYSICAL EXAM:  VITAL SIGNS: ED Triage Vitals  Enc Vitals Group     BP 04/23/19 1352 112/69     Pulse Rate 04/23/19 1352 85     Resp 04/23/19 1352 18     Temp 04/23/19 1352 100 F (37.8 C)     Temp Source 04/23/19 1352 Oral     SpO2 04/23/19 1352 99 %     Weight 04/23/19 1353 250 lb (113.4 kg)     Height 04/23/19 1353 5\' 2"  (1.575 m)     Head Circumference --      Peak Flow --      Pain Score 04/23/19 1349 7     Pain Loc --  Pain Edu? --      Excl. in GC? --    Constitutional: Alert and oriented. Well appearing and in no acute distress.  Febrile. Eyes: Conjunctivae are normal. PERRL. EOMI. Head: Atraumatic. Nose: Edematous nasal turbinates clear rhinorrhea. Mouth/Throat: Mucous membranes are moist.  Oropharynx erythematous.  Postnasal drainage. Neck: No stridor.  Hematological/Lymphatic/Immunilogical: No cervical lymphadenopathy. Cardiovascular: Normal rate, regular rhythm. Grossly normal heart sounds.  Good peripheral circulation. Respiratory: Normal respiratory effort.  No retractions. Lungs CTAB. Gastrointestinal: Soft and nontender. No distention. No abdominal bruits.  No CVA tenderness. Genitourinary: Deferred. Neurologic:  Normal speech and language. No gross focal neurologic deficits are appreciated. No gait instability. Skin:  Skin is warm, dry and intact. No rash noted. Psychiatric: Mood and affect are normal. Speech and behavior are normal.  ____________________________________________   LABS (all labs ordered are listed, but only abnormal results are displayed)  Labs Reviewed  GROUP A STREP BY PCR - Abnormal; Notable for the following components:      Result Value   Group A Strep by PCR DETECTED (*)    All other components within normal limits  SARS CORONAVIRUS 2 (HOSPITAL ORDER, PERFORMED IN [facility])   ____________________________________________  EKG   ____________________________________________  RADIOLOGY  ED MD interpretation:    Official radiology report(s): Dg Chest 1 View  Result Date: 04/23/2019 CLINICAL DATA:  Fever and productive cough EXAM: CHEST  1 VIEW COMPARISON:  None. FINDINGS: The heart size and mediastinal contours are within normal limits. Both lungs are clear. The visualized skeletal structures are unremarkable. IMPRESSION: No active disease. Electronically Signed   By: Marlan Palauharles  Clark M.D.   On: 04/23/2019 14:47    ____________________________________________   PROCEDURES  Procedure(s) performed (including Critical Care):  Procedures   ____________________________________________   INITIAL IMPRESSION / ASSESSMENT AND PLAN / ED COURSE  As part of my medical decision making, I reviewed the following data within the electronic MEDICAL RECORD NUMBER         Candice EndoCharlene Schelling was evaluated in Emergency Department on 04/23/2019 for the symptoms described in the history of present illness. She was evaluated in the context of the global COVID-19 pandemic, which necessitated consideration that the patient might be at risk for infection with the SARS-CoV-2 virus that causes COVID-19. Institutional  protocols and algorithms that pertain to the evaluation of patients at risk for COVID-19 are in a state of rapid change based on information released by regulatory bodies including the CDC and federal and state organizations. These policies and algorithms were followed during the patient's care in the ED.  Patient presents with fever, sore throat, cough, body aches.  Patient physical exam consistent with pharyngitis.  Patient test positive strep pharyngitis.  COVID test results are pending.  Patient advised self quarantine and take medication as directed pending COVID test results.       ____________________________________________   FINAL CLINICAL IMPRESSION(S) / ED DIAGNOSES  Final diagnoses:  Strep pharyngitis  URI with cough and congestion     ED Discharge Orders         Ordered    amoxicillin (AMOXIL) 500 MG capsule  3 times daily     04/23/19 1613    guaiFENesin-Codeine 100-6.3 MG/5ML SOLN  4 times daily     04/23/19 1613    ibuprofen (ADVIL) 600 MG tablet  Every 8 hours PRN     04/23/19 1613           Note:  This document was prepared using  [employer]agon [employer] forces training and education officer and may include unintentional dictation errors.    Sable Feil, PA-C 04/23/19 1615    Earleen Newport, MD 04/24/19 [phone]

## 2019-04-23 NOTE — ED Triage Notes (Signed)
Pt reports this am started with some CP, cough, fever and not feeling well. Pt reports had a MD appointment at 2:00 today but they told her to just come to the ED instead.

## 2019-04-23 NOTE — ED Notes (Signed)
Date and time results received: 04/23/19 1624  Test: COVID-19 Critical Value: POSITIVE  Name of Provider Notified: Cannon Kettle, [place]  Orders Received? Or Actions Taken?: new work note printed

## 2019-04-28 ENCOUNTER — Emergency Department: Payer: 59

## 2019-04-28 ENCOUNTER — Encounter: Payer: Self-pay | Admitting: Emergency Medicine

## 2019-04-28 ENCOUNTER — Other Ambulatory Visit: Payer: Self-pay

## 2019-04-28 ENCOUNTER — Emergency Department
Admission: EM | Admit: 2019-04-28 | Discharge: 2019-04-28 | Disposition: A | Discharge status: Home / Self Care | Payer: 59 | Attending: Emergency Medicine | Admitting: Emergency Medicine

## 2019-04-28 DIAGNOSIS — G44209 Tension-type headache, unspecified, not intractable: Secondary | ICD-10-CM | POA: Insufficient documentation

## 2019-04-28 DIAGNOSIS — U071 COVID-19: Secondary | ICD-10-CM | POA: Diagnosis not present

## 2019-04-28 DIAGNOSIS — R51 Headache: Secondary | ICD-10-CM | POA: Diagnosis present

## 2019-04-28 DIAGNOSIS — J45909 Unspecified asthma, uncomplicated: Secondary | ICD-10-CM | POA: Insufficient documentation

## 2019-04-28 DIAGNOSIS — R519 Headache, unspecified: Secondary | ICD-10-CM

## 2019-04-28 LAB — URINALYSIS, COMPLETE (UACMP) WITH MICROSCOPIC
Bilirubin Urine: NEGATIVE
Glucose, UA: NEGATIVE mg/dL
Ketones, ur: NEGATIVE mg/dL
Nitrite: NEGATIVE
Protein, ur: 30 mg/dL — AB
RBC / HPF: >50 RBC/hpf — ABNORMAL HIGH (ref 0–5)
Specific Gravity, Urine: 1.033 — ABNORMAL HIGH (ref 1.005–1.030)
pH: 5 (ref 5.0–8.0)

## 2019-04-28 LAB — BASIC METABOLIC PANEL
Anion gap: 7 (ref 5–15)
BUN: 13 mg/dL (ref 6–20)
CO2: 24 mmol/L (ref 22–32)
Calcium: 9 mg/dL (ref 8.9–10.3)
Chloride: 107 mmol/L (ref 98–111)
Creatinine, Ser: 0.56 mg/dL (ref 0.44–1.00)
GFR calc Af Amer: >60 mL/min (ref >60)
GFR calc non Af Amer: >60 mL/min (ref >60)
Glucose, Bld: 125 mg/dL — ABNORMAL HIGH (ref 70–99)
Potassium: 3.8 mmol/L (ref 3.5–5.1)
Sodium: 138 mmol/L (ref 135–145)

## 2019-04-28 LAB — POCT PREGNANCY, URINE: Preg Test, Ur: NEGATIVE

## 2019-04-28 LAB — CBC WITH DIFFERENTIAL/PLATELET
Abs Immature Granulocytes: 0.01 10*3/uL (ref 0.00–0.07)
Basophils Absolute: 0 10*3/uL (ref 0.0–0.1)
Basophils Relative: 1 %
Eosinophils Absolute: 0.1 10*3/uL (ref 0.0–0.5)
Eosinophils Relative: 2 %
HCT: 40.3 % (ref 36.0–46.0)
Hemoglobin: 13.3 g/dL (ref 12.0–15.0)
Immature Granulocytes: 0 %
Lymphocytes Relative: 41 %
Lymphs Abs: 2.4 10*3/uL (ref 0.7–4.0)
MCH: 26.9 pg (ref 26.0–34.0)
MCHC: 33 g/dL (ref 30.0–36.0)
MCV: 81.4 fL (ref 80.0–100.0)
Monocytes Absolute: 0.3 10*3/uL (ref 0.1–1.0)
Monocytes Relative: 5 %
Neutro Abs: 3 10*3/uL (ref 1.7–7.7)
Neutrophils Relative %: 51 %
Platelets: 444 10*3/uL — ABNORMAL HIGH (ref 150–400)
RBC: 4.95 MIL/uL (ref 3.87–5.11)
RDW: 14.1 % (ref 11.5–15.5)
WBC: 5.9 10*3/uL (ref 4.0–10.5)
nRBC: 0 % (ref 0.0–0.2)

## 2019-04-28 MED ORDER — ACETAMINOPHEN 325 MG PO TABS
650.0000 mg | ORAL_TABLET | Freq: Once | ORAL | Status: AC
  Administered 2019-04-28: 650 mg via ORAL
  Filled 2019-04-28: qty 2

## 2019-04-28 NOTE — ED Provider Notes (Signed)
[facility] Emergency Department Provider Note  ____________________________________________   I have reviewed the triage vital signs and the nursing notes. Where available I have reviewed prior notes and, if possible and indicated, outside hospital notes.   Patient seen and evaluated during the coronavirus epidemic during a time with low staffing  Patient seen for the symptoms described in the history of present illness. She was evaluated in the context of the global COVID-19 pandemic, which necessitated consideration that the patient might be at risk for infection with the SARS-CoV-2 virus that causes COVID-19. Institutional protocols and algorithms that pertain to the evaluation of patients at risk for COVID-19 are in a state of rapid change based on information released by regulatory bodies including the CDC and federal and state organizations. These policies and algorithms were followed during the patient's care in the ED.    HISTORY  Chief Complaint Headache    HPI Candice Moore is a 28 y.o. female  With COVID-19, presents today complaining of her whole body hurting, including headaches, back pain, and just generally feeling unwell.  This is been going on since her diagnosis.  No focal numbness or weakness.  No sudden headache.  No atypical headaches for her.  She takes ibuprofen and  the headache seemed to get better and then they come back.  She just feels that she is battling something. She is on her menstrual period at this time.  She denies pregnancy.  She denies significant cough or shortness of breath is not having fevers just mostly body aches and headaches.     Past Medical History:  Diagnosis Date  . Asthma   . GERD (gastroesophageal reflux disease)     Patient Active Problem List   Diagnosis Date Noted  . Postpartum care following vaginal delivery 11/04/2016  . Normal labor 11/02/2016    Past Surgical History:  Procedure Laterality  Date  . CHOLECYSTECTOMY      Prior to Admission medications   Medication Sig Start Date End Date Taking? Authorizing Provider  amoxicillin (AMOXIL) 500 MG capsule Take 1 capsule (500 mg total) by mouth 3 (three) times daily. 04/23/19   Joni ReiningSmith, Ronald K, PA-C  guaiFENesin-Codeine 100-6.3 MG/5ML SOLN Take 5 mLs by mouth 4 (four) times daily. 04/23/19   Joni ReiningSmith, Ronald K, PA-C  ibuprofen (ADVIL) 600 MG tablet Take 1 tablet (600 mg total) by mouth every 8 (eight) hours as needed. 04/23/19   Joni ReiningSmith, Ronald K, PA-C  medroxyPROGESTERone (DEPO-PROVERA) 150 MG/ML injection Inject 1 mL (150 mg total) into the muscle every 3 (three) months. 01/28/17 01/23/18  Tresea MallGledhill, Jane, CNM  Prenatal Vit-Fe Fumarate-FA (MULTIVITAMIN-PRENATAL) 27-0.8 MG TABS tablet Take 1 tablet by mouth daily at 12 noon.    [provider]    Allergies Patient has no known allergies.  Family History  Problem Relation Age of Onset  . Breast cancer Maternal Grandmother   . Colon cancer Maternal Grandfather     Social History Social History   Tobacco Use  . Smoking status: Never Smoker  . Smokeless tobacco: Never Used  Substance Use Topics  . Alcohol use: No  . Drug use: No    Review of Systems Constitutional: No fever/chills Eyes: No visual changes. ENT: No sore throat. No stiff neck no neck pain Cardiovascular: Denies chest pain. Respiratory: Denies shortness of breath. Gastrointestinal:   no vomiting.  No diarrhea.  No constipation. Genitourinary: Negative for dysuria. Musculoskeletal: Negative lower extremity swelling Skin: Negative for rash. Neurological: Negative for severe  atypical headaches, focal weakness or numbness.   ____________________________________________   PHYSICAL EXAM:  VITAL SIGNS: ED Triage Vitals  Enc Vitals Group     BP 04/28/19 1619 (!) 143/70     Pulse Rate 04/28/19 1619 73     Resp --      Temp 04/28/19 1619 98.8 F (37.1 C)     Temp Source 04/28/19 1619 Oral     SpO2  04/28/19 1619 97 %     Weight 04/28/19 1618 250 lb (113.4 kg)     Height --      Head Circumference --      Peak Flow --      Pain Score 04/28/19 1617 7     Pain Loc --      Pain Edu? --      Excl. in GC? --     Constitutional: Alert and oriented. Well appearing and in no acute distress. Eyes: Conjunctivae are normal Head: Atraumatic HEENT: No congestion/rhinnorhea. Mucous membranes are moist.  Oropharynx non-erythematous Neck:   Nontender with no meningismus, no masses, no stridor Cardiovascular: Normal rate, regular rhythm. Grossly normal heart sounds.  Good peripheral circulation. Respiratory: Normal respiratory effort.  No retractions. Lungs CTAB. Abdominal: Soft and nontender. No distention. No guarding no rebound Back:  There is no focal tenderness or step off.  there is no midline tenderness there are no lesions noted. there is no CVA tenderness Musculoskeletal: No lower extremity tenderness, no upper extremity tenderness. No joint effusions, no DVT signs strong distal pulses no edema Neurologic:  Normal speech and language. No gross focal neurologic deficits are appreciated.  Skin:  Skin is warm, dry and intact. No rash noted. Psychiatric: Mood and affect are normal. Speech and behavior are normal.  ____________________________________________   LABS (all labs ordered are listed, but only abnormal results are displayed)  Labs Reviewed  CBC WITH DIFFERENTIAL/PLATELET - Abnormal; Notable for the following components:      Result Value   Platelets 444 (*)    All other components within normal limits  BASIC METABOLIC PANEL - Abnormal; Notable for the following components:   Glucose, Bld 125 (*)    All other components within normal limits  URINALYSIS, COMPLETE (UACMP) WITH MICROSCOPIC - Abnormal; Notable for the following components:   Color, Urine YELLOW (*)    APPearance HAZY (*)    Specific Gravity, Urine 1.033 (*)    Hgb urine dipstick LARGE (*)    Protein, ur 30  (*)    Leukocytes,Ua TRACE (*)    RBC / HPF >50 (*)    Bacteria, UA RARE (*)    All other components within normal limits  POC URINE PREG, ED  POCT PREGNANCY, URINE    Pertinent labs  results that were available during my care of the patient were reviewed by me and considered in my medical decision making (see chart for details). ____________________________________________  EKG  I personally interpreted any EKGs ordered by me or triage  ____________________________________________  RADIOLOGY  Pertinent labs & imaging results that were available during my care of the patient were reviewed by me and considered in my medical decision making (see chart for details). If possible, patient and/or family made aware of any abnormal findings.  Dg Chest Port 1 View  Result Date: 04/28/2019 CLINICAL DATA:  COVID-19 positive EXAM: PORTABLE CHEST 1 VIEW COMPARISON:  04/23/2019 FINDINGS: The heart size and mediastinal contours are within normal limits. Both lungs are clear. The visualized skeletal structures are unremarkable.  IMPRESSION: No active disease. Electronically Signed   By: Donavan Foil M.D.   On: 04/28/2019 17:22   ____________________________________________    PROCEDURES  Procedure(s) performed: None  Procedures  Critical Care performed: None  ____________________________________________   INITIAL IMPRESSION / ASSESSMENT AND PLAN / ED COURSE  Pertinent labs & imaging results that were available during my care of the patient were reviewed by me and considered in my medical decision making (see chart for details).   Patient in no acute distress, does have diagnosed coronavirus.  She is on her period.  We will send urine culture.  No dysuria or urinary frequency.  Not appear to be a clean-catch.  Patient declines cath.  There is no indication for acute admission to the hospital, patient has nothing to suggest that she has decompensated coronavirus including hypoxia or change  in chest x-ray etc.  Patient does have a headache but she is neurologically intact with no meningismus, obviously the coronavirus can present with all sorts of different complaints and complications but at this time there is nothing to suggest that she is got a venous thrombosis, CVA, head bleed, meningitis, and again she is quite well-appearing.  I do not think LP is indicated in this patient.  We have a clear source of cause for her headache and the fact is that her headache is only one component of diffuse myalgias that she is actually been suffering from since this started.  Did talk about LP, patient also does not want it done.  Understands risk-benefit alternative to LP.  We will try anti-pain medications using Tylenol in addition to her Motrin.  And I have advised her that if she has worsening significant way she needs to return but at this time, she is I think making her way through a coronavirus infection without any obvious evidence of pathology requiring further intervention or imaging.    ____________________________________________   FINAL CLINICAL IMPRESSION(S) / ED DIAGNOSES  Final diagnoses:  COVID-19      This chart was dictated using voice recognition software.  Despite best efforts to proofread,  errors can occur which can change meaning.      Schuyler Amor, MD 04/28/19 [phone]

## 2019-04-28 NOTE — ED Triage Notes (Signed)
C/O headache, abdominal pain, and body numbness.  Patient was told to come to ED for evaluation.  Also c/o bilateral ankle swelling.  Patient tested positive for COVID on 04/23/19.  Patient is AAOx3.  Skin warm and dry. NAD

## 2019-04-28 NOTE — Discharge Instructions (Signed)
At this time, your blood work is reassuring, we do advise taking Tylenol in addition to Motrin for your headaches.  We are sending a urine culture to see if you possibly have a urinary tract infection but we will hold off on antibiotics pending that result.  If you have high fevers, pain when you urinate back pain that is different from what you have been having or other concerns return to the ER.  The biggest concern with coronavirus is that you might have real problems breathing.  At this time fortunately your oxygen levels are 100% and your chest x-ray is clear.  If however your home nurse or you notices that you have increased work of breathing or any focal numbness or weakness in 1 part of your body, logic problem change in your headaches, or other concerns please return to the ER.  Otherwise, keep following up closely with your primary care doctor.

## 2019-04-30 LAB — URINE CULTURE: Culture: 70000 — AB

## 2019-05-01 ENCOUNTER — Telehealth: Payer: Self-pay

## 2019-05-01 NOTE — Telephone Encounter (Signed)
Discussed case with Dr. William Hamburger. Pt did not present with any urinary symptoms and Urine catch was not clean. Ucx grew 70,000 ESBL E.coli. As pt present with asymptomatic bacteruria, we decided not to treat the patient with antibiotics per the asymptomatic bacteriuria guidelines. Pt is not pregnant. I tried to reach the patient via phone, but no answer. Left voice message and call back number. Called patient to see if she was having any symptoms such as fever, urinary frequency, dysuria, or flank pain/back pain. Will try again tomorrow. If patient has any symptoms listed above please call in Nitrofurantoin 100 mg BID x 5 day per Dr. William Hamburger.   Thanks,   Eleonore Chiquito, PharmD, BCPS

## 2019-05-01 NOTE — Consult Note (Signed)
Discussed case with Dr. Issac. Pt did not present with any urinary symptoms and Urine catch was not clean. Ucx grew 70,000 ESBL E.coli. As pt present with asymptomatic bacteruria, we decided not to treat the patient with antibiotics per the asymptomatic bacteriuria guidelines. Pt is not pregnant. I tried to reach the patient via phone, but no answer. Left voice message and call back number. Called patient to see if she was having any symptoms such as fever, urinary frequency, dysuria, or flank pain/back pain. Will try again tomorrow. If patient has any symptoms listed above please call in Nitrofurantoin 100 mg BID x 5 day per Dr. Issac.   Thanks,   Lachele Lievanos, PharmD, BCPS 

## 2019-05-02 NOTE — Progress Notes (Signed)
ED Antimicrobial Stewardship Positive Culture Follow Up   Ahmoni Edge is an 28 y.o. female who presented to [facility] on 04/28/2019 with a chief complaint of  Chief Complaint  Patient presents with  . Headache    Recent Results (from the past 720 hour(s))  SARS Coronavirus 2 (CEPHEID- Performed in [place] hospital lab), Hosp Order     Status: Abnormal   Collection Time: 04/23/19  2:10 PM   Specimen: Nasopharyngeal Swab  Result Value Ref Range Status   SARS Coronavirus 2 POSITIVE (A) NEGATIVE Final    Comment: RESULT CALLED TO, READ BACK BY AND VERIFIED WITH: ALICIA GRANGER @1624  ON 04/23/2019 BY FMW (NOTE) If result is NEGATIVE SARS-CoV-2 target nucleic acids are NOT DETECTED. The SARS-CoV-2 RNA is generally detectable in upper and lower  respiratory specimens during the acute phase of infection. The lowest  concentration of SARS-CoV-2 viral copies this assay can detect is 250  copies / mL. A negative result does not preclude SARS-CoV-2 infection  and should not be used as the sole basis for treatment or other  patient management decisions.  A negative result may occur with  improper specimen collection / handling, submission of specimen other  than nasopharyngeal swab, presence of viral mutation(s) within the  areas targeted by this assay, and inadequate number of viral copies  (<250 copies / mL). A negative result must be combined with clinical  observations, patient history, and epidemiological information. If result is POSITIVE SARS-CoV-2 target nucleic acids are DETECT ED. The SARS-CoV-2 RNA is generally detectable in upper and lower  respiratory specimens during the acute phase of infection.  Positive  results are indicative of active infection with SARS-CoV-2.  Clinical  correlation with patient history and other diagnostic information is  necessary to determine patient infection status.  Positive results do  not rule out bacterial infection or co-infection with  other viruses. If result is PRESUMPTIVE POSTIVE SARS-CoV-2 nucleic acids MAY BE PRESENT.   A presumptive positive result was obtained on the submitted specimen  and confirmed on repeat testing.  While 2019 novel coronavirus  (SARS-CoV-2) nucleic acids may be present in the submitted sample  additional confirmatory testing may be necessary for epidemiological  and / or clinical management purposes  to differentiate between  SARS-CoV-2 and other Sarbecovirus currently known to infect humans.  If clinically indicated additional testing with an alternate test  methodology (LAB74 53) is advised. The SARS-CoV-2 RNA is generally  detectable in upper and lower respiratory specimens during the acute  phase of infection. The expected result is Negative. Fact Sheet for Patients:  StrictlyIdeas.no Fact Sheet for Healthcare Providers: BankingDealers.co.za This test is not yet approved or cleared by the [place] FDA and has been authorized for detection and/or diagnosis of SARS-CoV-2 by FDA under an Emergency Use Authorization (EUA).  This EUA will remain in effect (meaning this test can be used) for the duration of the COVID-19 declaration under Section 564(b)(1) of the Act, 21 U.S.C. section 360bbb-3(b)(1), unless the authorization is terminated or revoked sooner. Performed at [facility], [address] [place], [place] [address]   Group A Strep by PCR     Status: Abnormal   Collection Time: 04/23/19  2:57 PM   Specimen: Nasopharyngeal Swab; Sterile Swab  Result Value Ref Range Status   Group A Strep by PCR DETECTED (A) NOT DETECTED Final    Comment: Performed at [facility], [address], [place], [place] [address]  Urine culture  Status: Abnormal   Collection Time: 04/28/19  4:26 PM   Specimen: Urine, Random  Result Value Ref Range Status   Specimen Description   Final    URINE, RANDOM Performed at [facility], [address] [place], [place] [address]    Special Requests   Final    NONE Performed at [facility], [address] [place], [place] [address]    Culture (A)  Final    70,000 COLONIES/mL ESCHERICHIA COLI Confirmed Extended Spectrum Beta-Lactamase Producer (ESBL).  In bloodstream infections from ESBL organisms, carbapenems are preferred over piperacillin/tazobactam. They are shown to have a lower risk of mortality.    Report Status 04/30/2019 FINAL  Final   Organism ID, Bacteria ESCHERICHIA COLI (A)  Final      Susceptibility   Escherichia coli - MIC*    AMPICILLIN >=32 RESISTANT Resistant     CEFAZOLIN >=64 RESISTANT Resistant     CEFTRIAXONE >=64 RESISTANT Resistant     CIPROFLOXACIN >=4 RESISTANT Resistant     GENTAMICIN <=1 SENSITIVE Sensitive     IMIPENEM <=0.25 SENSITIVE Sensitive     NITROFURANTOIN <=16 SENSITIVE Sensitive     TRIMETH/SULFA <=20 SENSITIVE Sensitive     AMPICILLIN/SULBACTAM 16 INTERMEDIATE Intermediate     PIP/TAZO <=4 SENSITIVE Sensitive     Extended ESBL POSITIVE Resistant     * 70,000 COLONIES/mL ESCHERICHIA COLI    []  Patient discharged originally without antimicrobial agent and treatment may be  Indicated?  From Previous Note:Discussed case with Dr. Orpah ClintonIssac. Pt did not present with any urinary symptoms and Urine catch was not clean. Ucx grew 70,000 ESBL E.coli. As pt present with asymptomatic bacteruria, we decided not to treat the patient with antibiotics per the asymptomatic bacteriuria guidelines. Pt is not pregnant. I tried to reach the patient via phone, but no answer. Left voice message and call back number. Called patient to see if she was having any symptoms such as fever, urinary frequency, dysuria, or flank pain/back pain. Will try again tomorrow. If patient has any symptoms listed above please call in Nitrofurantoin 100 mg BID x 5 day per Dr. Orpah ClintonIssac  8/8 1527 2nd attempt: Called patient and spoke with [phone]  Asked if she was having any urinary symptoms, needing to use the bathroom to pee frequency or pain/burning on urination, flank/back pain. Patient denies any of the above and says she is feeling OK. Instructed if any of these symptoms start to contact provider or call or come back in.   Rolonda Pontarelli A 05/02/2019, 3:29 PM Clinical Pharmacist

## 2019-06-15 ENCOUNTER — Other Ambulatory Visit: Payer: Self-pay | Admitting: Family Medicine

## 2019-06-15 DIAGNOSIS — Z3201 Encounter for pregnancy test, result positive: Secondary | ICD-10-CM

## 2019-06-23 ENCOUNTER — Ambulatory Visit: Payer: 59

## 2019-06-23 ENCOUNTER — Other Ambulatory Visit: Payer: Self-pay

## 2019-06-23 ENCOUNTER — Ambulatory Visit
Admission: RE | Admit: 2019-06-23 | Discharge: 2019-06-23 | Disposition: A | Discharge status: Home / Self Care | Payer: 59 | Source: Ambulatory Visit | Attending: Family Medicine | Admitting: Family Medicine

## 2019-06-23 DIAGNOSIS — Z3201 Encounter for pregnancy test, result positive: Secondary | ICD-10-CM

## 2019-06-30 ENCOUNTER — Other Ambulatory Visit: Payer: Self-pay

## 2019-06-30 ENCOUNTER — Encounter: Payer: Self-pay | Admitting: Intensive Care

## 2019-06-30 ENCOUNTER — Emergency Department
Admission: EM | Admit: 2019-06-30 | Discharge: 2019-06-30 | Disposition: A | Discharge status: Home / Self Care | Payer: 59 | Attending: Emergency Medicine | Admitting: Emergency Medicine

## 2019-06-30 DIAGNOSIS — O99511 Diseases of the respiratory system complicating pregnancy, first trimester: Secondary | ICD-10-CM | POA: Insufficient documentation

## 2019-06-30 DIAGNOSIS — Z3A08 8 weeks gestation of pregnancy: Secondary | ICD-10-CM | POA: Diagnosis not present

## 2019-06-30 DIAGNOSIS — O219 Vomiting of pregnancy, unspecified: Secondary | ICD-10-CM | POA: Insufficient documentation

## 2019-06-30 DIAGNOSIS — Z79899 Other long term (current) drug therapy: Secondary | ICD-10-CM | POA: Insufficient documentation

## 2019-06-30 DIAGNOSIS — J45909 Unspecified asthma, uncomplicated: Secondary | ICD-10-CM | POA: Insufficient documentation

## 2019-06-30 LAB — COMPREHENSIVE METABOLIC PANEL
ALT: 29 U/L (ref 0–44)
AST: 19 U/L (ref 15–41)
Albumin: 4 g/dL (ref 3.5–5.0)
Alkaline Phosphatase: 78 U/L (ref 38–126)
Anion gap: 12 (ref 5–15)
BUN: 6 mg/dL (ref 6–20)
CO2: 21 mmol/L — ABNORMAL LOW (ref 22–32)
Calcium: 9.4 mg/dL (ref 8.9–10.3)
Chloride: 103 mmol/L (ref 98–111)
Creatinine, Ser: 0.51 mg/dL (ref 0.44–1.00)
GFR calc Af Amer: >60 mL/min (ref >60)
GFR calc non Af Amer: >60 mL/min (ref >60)
Glucose, Bld: 97 mg/dL (ref 70–99)
Potassium: 3.7 mmol/L (ref 3.5–5.1)
Sodium: 136 mmol/L (ref 135–145)
Total Bilirubin: 0.5 mg/dL (ref 0.3–1.2)
Total Protein: 8.3 g/dL — ABNORMAL HIGH (ref 6.5–8.1)

## 2019-06-30 LAB — CBC WITH DIFFERENTIAL/PLATELET
Abs Immature Granulocytes: 0.02 10*3/uL (ref 0.00–0.07)
Basophils Absolute: 0 10*3/uL (ref 0.0–0.1)
Basophils Relative: 1 %
Eosinophils Absolute: 0.1 10*3/uL (ref 0.0–0.5)
Eosinophils Relative: 1 %
HCT: 37.1 % (ref 36.0–46.0)
Hemoglobin: 12.3 g/dL (ref 12.0–15.0)
Immature Granulocytes: 0 %
Lymphocytes Relative: 24 %
Lymphs Abs: 2.1 10*3/uL (ref 0.7–4.0)
MCH: 27.1 pg (ref 26.0–34.0)
MCHC: 33.2 g/dL (ref 30.0–36.0)
MCV: 81.7 fL (ref 80.0–100.0)
Monocytes Absolute: 0.4 10*3/uL (ref 0.1–1.0)
Monocytes Relative: 5 %
Neutro Abs: 6 10*3/uL (ref 1.7–7.7)
Neutrophils Relative %: 69 %
Platelets: 457 10*3/uL — ABNORMAL HIGH (ref 150–400)
RBC: 4.54 MIL/uL (ref 3.87–5.11)
RDW: 14.4 % (ref 11.5–15.5)
WBC: 8.6 10*3/uL (ref 4.0–10.5)
nRBC: 0 % (ref 0.0–0.2)

## 2019-06-30 LAB — URINALYSIS, COMPLETE (UACMP) WITH MICROSCOPIC
Bacteria, UA: NONE SEEN
Bilirubin Urine: NEGATIVE
Glucose, UA: NEGATIVE mg/dL
Hgb urine dipstick: NEGATIVE
Ketones, ur: 80 mg/dL — AB
Leukocytes,Ua: NEGATIVE
Nitrite: NEGATIVE
Protein, ur: NEGATIVE mg/dL
Specific Gravity, Urine: 1.02 (ref 1.005–1.030)
pH: 6 (ref 5.0–8.0)

## 2019-06-30 LAB — HCG, QUANTITATIVE, PREGNANCY: hCG, Beta Chain, Quant, S: 63231 m[IU]/mL — ABNORMAL HIGH (ref <5)

## 2019-06-30 MED ORDER — LACTATED RINGERS IV BOLUS
1000.0000 mL | Freq: Once | INTRAVENOUS | Status: AC
  Administered 2019-06-30: 1000 mL via INTRAVENOUS

## 2019-06-30 MED ORDER — PROMETHAZINE HCL 12.5 MG PO TABS: 12.5000 mg | ORAL_TABLET | Freq: Four times a day (QID) | ORAL | 0 refills | Status: DC | PRN

## 2019-06-30 MED ORDER — PROMETHAZINE HCL 25 MG/ML IJ SOLN
12.5000 mg | Freq: Once | INTRAMUSCULAR | Status: AC
  Administered 2019-06-30: 12.5 mg via INTRAVENOUS
  Filled 2019-06-30: qty 1

## 2019-06-30 NOTE — ED Notes (Signed)
AAOx3.  Skin warm and dry.  NAD 

## 2019-06-30 NOTE — ED Triage Notes (Signed)
Patient currently [redacted] weeks pregnant and reports she has not been able to eat or drink anything in a couple of days

## 2019-06-30 NOTE — ED Notes (Signed)
Pt denies any spotting, discharge, pain

## 2019-06-30 NOTE — ED Provider Notes (Signed)
[facility] Emergency Department Provider Note   ____________________________________________   First MD Initiated Contact with Patient 06/30/19 1133     (approximate)  I have reviewed the triage vital signs and the nursing notes.   HISTORY  Chief Complaint Emesis During Pregnancy    HPI Candice Moore is a 28 y.o. female G3 P2-0-0-2 at approximately 8 weeks of pregnancy presents to the ED complaining of nausea and vomiting.  Patient states she has been dealing with persistent nausea for the past couple of weeks with associated nonbilious and nonbloody vomiting.  She states she often vomits as much as twice per day and has difficulty tolerating solid food.  She states she is able to tolerate liquids, but is constantly feeling nauseous and uncomfortable.  She denies any associated abdominal pain, dysuria, hematuria, flank pain, fevers, vaginal bleeding, or vaginal discharge.  She had an ultrasound performed earlier in this pregnancy that was unremarkable, has not yet been able to follow-up with OB.        Past Medical History:  Diagnosis Date  . Asthma   . GERD (gastroesophageal reflux disease)     Patient Active Problem List   Diagnosis Date Noted  . Postpartum care following vaginal delivery 11/04/2016  . Normal labor 11/02/2016    Past Surgical History:  Procedure Laterality Date  . CHOLECYSTECTOMY      Prior to Admission medications   Medication Sig Start Date End Date Taking? Authorizing Provider  amoxicillin (AMOXIL) 500 MG capsule Take 1 capsule (500 mg total) by mouth 3 (three) times daily. 04/23/19   Joni Reining, PA-C  guaiFENesin-Codeine 100-6.3 MG/5ML SOLN Take 5 mLs by mouth 4 (four) times daily. 04/23/19   Joni Reining, PA-C  ibuprofen (ADVIL) 600 MG tablet Take 1 tablet (600 mg total) by mouth every 8 (eight) hours as needed. 04/23/19   Joni Reining, PA-C  medroxyPROGESTERone (DEPO-PROVERA) 150 MG/ML injection Inject 1 mL  (150 mg total) into the muscle every 3 (three) months. 01/28/17 01/23/18  Tresea Mall, CNM  Prenatal Vit-Fe Fumarate-FA (MULTIVITAMIN-PRENATAL) 27-0.8 MG TABS tablet Take 1 tablet by mouth daily at 12 noon.    [provider]  promethazine (PHENERGAN) 12.5 MG tablet Take 1 tablet (12.5 mg total) by mouth every 6 (six) hours as needed for nausea or vomiting. 06/30/19   Chesley Noon, MD    Allergies Patient has no known allergies.  Family History  Problem Relation Age of Onset  . Breast cancer Maternal Grandmother   . Colon cancer Maternal Grandfather     Social History Social History   Tobacco Use  . Smoking status: Never Smoker  . Smokeless tobacco: Never Used  Substance Use Topics  . Alcohol use: No  . Drug use: No    Review of Systems  Constitutional: No fever/chills Eyes: No visual changes. ENT: No sore throat. Cardiovascular: Denies chest pain. Respiratory: Denies shortness of breath. Gastrointestinal: No abdominal pain.  Positive for nausea and vomiting.  No diarrhea.  No constipation. Genitourinary: Negative for dysuria. Musculoskeletal: Negative for back pain. Skin: Negative for rash. Neurological: Negative for headaches, focal weakness or numbness.  ____________________________________________   PHYSICAL EXAM:  VITAL SIGNS: ED Triage Vitals [06/30/19 1115]  Enc Vitals Group     BP 133/75     Pulse Rate 83     Resp 16     Temp 98.2 F (36.8 C)     Temp Source Oral     SpO2 98 %  Weight 247 lb 6.4 oz (112.2 kg)     Height 5\' 2"  (1.575 m)     Head Circumference      Peak Flow      Pain Score 0     Pain Loc      Pain Edu?      Excl. in [place]?     Constitutional: Alert and oriented. Eyes: Conjunctivae are normal. Head: Atraumatic. Nose: No congestion/rhinnorhea. Mouth/Throat: Mucous membranes are moist. Neck: Normal ROM Cardiovascular: Normal rate, regular rhythm. Grossly normal heart sounds. Respiratory: Normal respiratory effort.   No retractions. Lungs CTAB. Gastrointestinal: Soft and nontender. No distention. Genitourinary: deferred Musculoskeletal: No lower extremity tenderness nor edema. Neurologic:  Normal speech and language. No gross focal neurologic deficits are appreciated. Skin:  Skin is warm, dry and intact. No rash noted. Psychiatric: Mood and affect are normal. Speech and behavior are normal.  ____________________________________________   LABS (all labs ordered are listed, but only abnormal results are displayed)  Labs Reviewed  CBC WITH DIFFERENTIAL/PLATELET - Abnormal; Notable for the following components:      Result Value   Platelets 457 (*)    All other components within normal limits  COMPREHENSIVE METABOLIC PANEL - Abnormal; Notable for the following components:   CO2 21 (*)    Total Protein 8.3 (*)    All other components within normal limits  HCG, QUANTITATIVE, PREGNANCY - Abnormal; Notable for the following components:   hCG, Beta Chain, Quant, S 63,231 (*)    All other components within normal limits  URINALYSIS, COMPLETE (UACMP) WITH MICROSCOPIC - Abnormal; Notable for the following components:   Color, Urine YELLOW (*)    APPearance CLEAR (*)    Ketones, ur 80 (*)    All other components within normal limits     PROCEDURES  Procedure(s) performed (including Critical Care):  Procedures   ____________________________________________   INITIAL IMPRESSION / ASSESSMENT AND PLAN / ED COURSE       28 year old female approximately [redacted] weeks pregnant presents to the ED complaining of persistent nausea and vomiting.  She has a benign and nonfocal abdominal exam and prior ultrasound this pregnancy showed intrauterine gestation.  Will screen labs and UA, treat with IV fluids and Phenergan.  Labs unremarkable and no evidence of UTI.  Patient feeling better following fluids and Phenergan.  Will prescribe Phenergan for home use and counseled patient to establish care with OB, patient  agrees with plan.      ____________________________________________   FINAL CLINICAL IMPRESSION(S) / ED DIAGNOSES  Final diagnoses:  Nausea and vomiting in pregnancy     ED Discharge Orders         Ordered    promethazine (PHENERGAN) 12.5 MG tablet  Every 6 hours PRN     06/30/19 1519           Note:  This document was prepared using Dragon voice recognition software and may include unintentional dictation errors.   Blake Divine, MD 06/30/19 1556

## 2019-07-29 ENCOUNTER — Emergency Department: Payer: 59

## 2019-07-29 ENCOUNTER — Other Ambulatory Visit: Payer: Self-pay

## 2019-07-29 ENCOUNTER — Emergency Department
Admission: EM | Admit: 2019-07-29 | Discharge: 2019-07-29 | Disposition: A | Discharge status: Home / Self Care | Payer: 59 | Attending: Emergency Medicine | Admitting: Emergency Medicine

## 2019-07-29 DIAGNOSIS — R1010 Upper abdominal pain, unspecified: Secondary | ICD-10-CM

## 2019-07-29 DIAGNOSIS — N898 Other specified noninflammatory disorders of vagina: Secondary | ICD-10-CM | POA: Diagnosis not present

## 2019-07-29 DIAGNOSIS — O23599 Infection of other part of genital tract in pregnancy, unspecified trimester: Secondary | ICD-10-CM | POA: Insufficient documentation

## 2019-07-29 DIAGNOSIS — J45909 Unspecified asthma, uncomplicated: Secondary | ICD-10-CM | POA: Diagnosis not present

## 2019-07-29 DIAGNOSIS — B9689 Other specified bacterial agents as the cause of diseases classified elsewhere: Secondary | ICD-10-CM | POA: Diagnosis not present

## 2019-07-29 DIAGNOSIS — O210 Mild hyperemesis gravidarum: Secondary | ICD-10-CM | POA: Insufficient documentation

## 2019-07-29 DIAGNOSIS — Z79899 Other long term (current) drug therapy: Secondary | ICD-10-CM | POA: Insufficient documentation

## 2019-07-29 DIAGNOSIS — Z3A12 12 weeks gestation of pregnancy: Secondary | ICD-10-CM | POA: Insufficient documentation

## 2019-07-29 DIAGNOSIS — O219 Vomiting of pregnancy, unspecified: Secondary | ICD-10-CM | POA: Diagnosis present

## 2019-07-29 LAB — CBC WITH DIFFERENTIAL/PLATELET
Abs Immature Granulocytes: 0.01 10*3/uL (ref 0.00–0.07)
Basophils Absolute: 0 10*3/uL (ref 0.0–0.1)
Basophils Relative: 1 %
Eosinophils Absolute: 0.1 10*3/uL (ref 0.0–0.5)
Eosinophils Relative: 2 %
HCT: 39 % (ref 36.0–46.0)
Hemoglobin: 13.1 g/dL (ref 12.0–15.0)
Immature Granulocytes: 0 %
Lymphocytes Relative: 25 %
Lymphs Abs: 1.9 10*3/uL (ref 0.7–4.0)
MCH: 27 pg (ref 26.0–34.0)
MCHC: 33.6 g/dL (ref 30.0–36.0)
MCV: 80.2 fL (ref 80.0–100.0)
Monocytes Absolute: 0.5 10*3/uL (ref 0.1–1.0)
Monocytes Relative: 7 %
Neutro Abs: 5 10*3/uL (ref 1.7–7.7)
Neutrophils Relative %: 65 %
Platelets: 345 10*3/uL (ref 150–400)
RBC: 4.86 MIL/uL (ref 3.87–5.11)
RDW: 13.6 % (ref 11.5–15.5)
WBC: 7.5 10*3/uL (ref 4.0–10.5)
nRBC: 0 % (ref 0.0–0.2)

## 2019-07-29 LAB — URINALYSIS, ROUTINE W REFLEX MICROSCOPIC
Bacteria, UA: NONE SEEN
Glucose, UA: NEGATIVE mg/dL
Hgb urine dipstick: NEGATIVE
Ketones, ur: 80 mg/dL — AB
Leukocytes,Ua: NEGATIVE
Nitrite: NEGATIVE
Protein, ur: 100 mg/dL — AB
Specific Gravity, Urine: 1.02 (ref 1.005–1.030)
pH: 5 (ref 5.0–8.0)

## 2019-07-29 LAB — WET PREP, GENITAL
Sperm: NONE SEEN
Trich, Wet Prep: NONE SEEN
Yeast Wet Prep HPF POC: NONE SEEN

## 2019-07-29 LAB — HCG, QUANTITATIVE, PREGNANCY: hCG, Beta Chain, Quant, S: 40992 m[IU]/mL — ABNORMAL HIGH (ref <5)

## 2019-07-29 LAB — COMPREHENSIVE METABOLIC PANEL
ALT: 96 U/L — ABNORMAL HIGH (ref 0–44)
AST: 49 U/L — ABNORMAL HIGH (ref 15–41)
Albumin: 3.8 g/dL (ref 3.5–5.0)
Alkaline Phosphatase: 109 U/L (ref 38–126)
Anion gap: 14 (ref 5–15)
BUN: 7 mg/dL (ref 6–20)
CO2: 20 mmol/L — ABNORMAL LOW (ref 22–32)
Calcium: 9.4 mg/dL (ref 8.9–10.3)
Chloride: 101 mmol/L (ref 98–111)
Creatinine, Ser: 0.54 mg/dL (ref 0.44–1.00)
GFR calc Af Amer: >60 mL/min (ref >60)
GFR calc non Af Amer: >60 mL/min (ref >60)
Glucose, Bld: 88 mg/dL (ref 70–99)
Potassium: 3.5 mmol/L (ref 3.5–5.1)
Sodium: 135 mmol/L (ref 135–145)
Total Bilirubin: 1.3 mg/dL — ABNORMAL HIGH (ref 0.3–1.2)
Total Protein: 8.2 g/dL — ABNORMAL HIGH (ref 6.5–8.1)

## 2019-07-29 LAB — BETA-HYDROXYBUTYRIC ACID: Beta-Hydroxybutyric Acid: 2.18 mmol/L — ABNORMAL HIGH (ref 0.05–0.27)

## 2019-07-29 LAB — MAGNESIUM: Magnesium: 1.8 mg/dL (ref 1.7–2.4)

## 2019-07-29 MED ORDER — METOCLOPRAMIDE HCL 5 MG/ML IJ SOLN
10.0000 mg | Freq: Once | INTRAMUSCULAR | Status: AC
  Administered 2019-07-29: 10 mg via INTRAVENOUS
  Filled 2019-07-29: qty 2

## 2019-07-29 MED ORDER — METOCLOPRAMIDE HCL 10 MG PO TABS: 10.0000 mg | ORAL_TABLET | Freq: Three times a day (TID) | ORAL | 0 refills | Status: DC

## 2019-07-29 MED ORDER — SODIUM CHLORIDE 0.9 % IV BOLUS
1000.0000 mL | Freq: Once | INTRAVENOUS | Status: AC
  Administered 2019-07-29: 1000 mL via INTRAVENOUS

## 2019-07-29 MED ORDER — METRONIDAZOLE 250 MG PO TABS: 250.0000 mg | ORAL_TABLET | Freq: Three times a day (TID) | ORAL | 0 refills | Status: AC

## 2019-07-29 MED ORDER — METOCLOPRAMIDE HCL 10 MG PO TABS: 10.0000 mg | ORAL_TABLET | Freq: Three times a day (TID) | ORAL | 0 refills | Status: DC | PRN

## 2019-07-29 MED ORDER — ONDANSETRON HCL 4 MG/2ML IJ SOLN
4.0000 mg | Freq: Once | INTRAMUSCULAR | Status: AC
  Administered 2019-07-29: 4 mg via INTRAVENOUS
  Filled 2019-07-29: qty 2

## 2019-07-29 MED ORDER — DEXTROSE 5 % AND 0.9 % NACL IV BOLUS
1000.0000 mL | Freq: Once | INTRAVENOUS | Status: DC
  Filled 2019-07-29: qty 1000

## 2019-07-29 MED ORDER — DEXTROSE 5 % AND 0.9 % NACL IV BOLUS
1000.0000 mL | Freq: Once | INTRAVENOUS | Status: AC
  Administered 2019-07-29: 1000 mL via INTRAVENOUS
  Filled 2019-07-29: qty 1000

## 2019-07-29 NOTE — ED Provider Notes (Signed)
[facility] Emergency Department Provider Note  ____________________________________________   First MD Initiated Contact with Patient 07/29/19 [phone]     (approximate)  I have reviewed the triage vital signs and the nursing notes.   HISTORY  Chief Complaint Emesis During Pregnancy    HPI Candice Moore is a 28 y.o. female currently [redacted] weeks pregnant who presents with emesis.  Patient does not have an established OB doctor given she is applying for Medicare at this time.  She is [facility].  She is currently being seen by a primary care doctor.  She is noted to have 30 pounds of weight loss since her pregnancy.  She says that she is not able to eat anything.  She continues to have vomiting even with Zofran but is severe, constant, nothing makes it better, nothing makes it worse.  She is also had a little bit of vaginal discharge but no vaginal bleeding.  No urinary symptoms. No abdominal pain.      Past Medical History:  Diagnosis Date  . Asthma   . GERD (gastroesophageal reflux disease)     Patient Active Problem List   Diagnosis Date Noted  . Postpartum care following vaginal delivery 11/04/2016  . Normal labor 11/02/2016    Past Surgical History:  Procedure Laterality Date  . CHOLECYSTECTOMY      Prior to Admission medications   Medication Sig Start Date End Date Taking? Authorizing Provider  ondansetron (ZOFRAN-ODT) 4 MG disintegrating tablet Take 4 mg by mouth every 8 (eight) hours as needed. 07/18/19   [provider]  Prenatal Vit-Fe Fumarate-FA (MULTIVITAMIN-PRENATAL) 27-0.8 MG TABS tablet Take 1 tablet by mouth daily at 12 noon.    [provider]  promethazine (PHENERGAN) 25 MG tablet Take 12.5-25 mg by mouth every 6 (six) hours as needed. 07/23/19   [provider]    Allergies Patient has no known allergies.  Family History  Problem Relation Age of Onset  . Breast cancer Maternal  Grandmother   . Colon cancer Maternal Grandfather     Social History Social History   Tobacco Use  . Smoking status: Never Smoker  . Smokeless tobacco: Never Used  Substance Use Topics  . Alcohol use: No  . Drug use: No      Review of Systems Constitutional: No fever/chills Eyes: No visual changes. ENT: No sore throat. Cardiovascular: Denies chest pain. Respiratory: Denies shortness of breath. Gastrointestinal: Positive vomiting..  No diarrhea.  No constipation. Genitourinary: Negative for dysuria.  Positive vaginal discharge Musculoskeletal: Negative for back pain. Skin: Negative for rash. Neurological: Negative for headaches, focal weakness or numbness. All other ROS negative ____________________________________________   PHYSICAL EXAM:  VITAL SIGNS: ED Triage Vitals  Enc Vitals Group     BP 07/29/19 0921 (!) 103/47     Pulse Rate 07/29/19 0921 (!) 103     Resp 07/29/19 0921 16     Temp 07/29/19 0922 98.4 F (36.9 C)     Temp Source 07/29/19 0921 Oral     SpO2 07/29/19 0921 99 %     Weight 07/29/19 0922 231 lb (104.8 kg)     Height 07/29/19 0922 5\' 2"  (1.575 m)     Head Circumference --      Peak Flow --      Pain Score 07/29/19 0921 5     Pain Loc --      Pain Edu? --      Excl. in [place]? --  Constitutional: Alert and oriented. Well appearing and in no acute distress. Eyes: Conjunctivae are normal. EOMI. Head: Atraumatic. Nose: No congestion/rhinnorhea. Mouth/Throat: Mucous membranes are moist.   Neck: No stridor. Trachea Midline. FROM Cardiovascular:tachycardiac, regular rhythm. Grossly normal heart sounds.  Good peripheral circulation. Respiratory: Normal respiratory effort.  No retractions. Lungs CTAB. Gastrointestinal: Soft and nontender. No distention. No abdominal bruits.  Musculoskeletal: No lower extremity tenderness nor edema.  No joint effusions. Neurologic:  Normal speech and language. No gross focal neurologic deficits are appreciated.   Skin:  Skin is warm, dry and intact. No rash noted. Psychiatric: Mood and affect are normal. Speech and behavior are normal. GU: Deferred   ____________________________________________   LABS (all labs ordered are listed, but only abnormal results are displayed)  Labs Reviewed  WET PREP, GENITAL - Abnormal; Notable for the following components:      Result Value   Clue Cells Wet Prep HPF POC PRESENT (*)    WBC, Wet Prep HPF POC FEW (*)    All other components within normal limits  COMPREHENSIVE METABOLIC PANEL - Abnormal; Notable for the following components:   CO2 20 (*)    Total Protein 8.2 (*)    AST 49 (*)    ALT 96 (*)    Total Bilirubin 1.3 (*)    All other components within normal limits  BETA-HYDROXYBUTYRIC ACID - Abnormal; Notable for the following components:   Beta-Hydroxybutyric Acid 2.18 (*)    All other components within normal limits  URINALYSIS, ROUTINE W REFLEX MICROSCOPIC - Abnormal; Notable for the following components:   Color, Urine AMBER (*)    APPearance HAZY (*)    Bilirubin Urine SMALL (*)    Ketones, ur 80 (*)    Protein, ur 100 (*)    All other components within normal limits  HCG, QUANTITATIVE, PREGNANCY - Abnormal; Notable for the following components:   hCG, Beta Chain, Quant, S 46,962 (*)    All other components within normal limits  GC/CHLAMYDIA PROBE AMP  CBC WITH DIFFERENTIAL/PLATELET  MAGNESIUM   ____________________________________________   ED ECG REPORT I, Concha Se, the attending physician, personally viewed and interpreted this ECG.  EKG normal sinus rate of 77, no ST elevation, no T wave inversions, normal intervals ____________________________________________  RADIOLOGY   Official radiology report(s): US Abdomen Limited Ruq  Result Date: 07/29/2019 CLINICAL DATA:  Upper abdominal pain. EXAM: ULTRASOUND ABDOMEN LIMITED RIGHT UPPER QUADRANT COMPARISON:  CT abdomen pelvis dated November 05, 2013. FINDINGS: Gallbladder:  Surgically absent. Common bile duct: Diameter: 6 mm, normal. Liver: 3.2 x 3.5 x 2.2 cm hyperechoic solid lesion in the left hepatic lobe with internal vascularity. Within normal limits in parenchymal echogenicity. Portal vein is patent on color Doppler imaging with normal direction of blood flow towards the liver. Other: None. IMPRESSION: 1. No acute abnormality. 2. 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 Differential considerations include a hemangioma, hepatic adenoma, or less likely focal nodular hyperplasia. Given the patient is pregnant and gadolinium contrast can therefore not be administered, a follow-up noncontrast MRI of the abdomen in 2-3 months is recommended to evaluate for lesion growth, which would suggest a hepatic adenoma since these are hormonally active. Stability in size would be more suggestive of a hemangioma or focal nodular hyperplasia. Electronically Signed   By: Obie Dredge M.D.   On: 07/29/2019 11:58    ____________________________________________   PROCEDURES  Procedure(s) performed (including Critical Care):  Procedures   ____________________________________________   INITIAL IMPRESSION / ASSESSMENT AND PLAN / ED COURSE  Candice Moore was evaluated in Emergency Department on 07/29/2019 for the symptoms described in the history of present illness. She was evaluated in the context of the global COVID-19 pandemic, which necessitated consideration that the patient might be at risk for infection with the SARS-CoV-2 virus that causes COVID-19. Institutional protocols and algorithms that pertain to the evaluation of patients at risk for COVID-19 are in a state of rapid change based on information released by regulatory bodies including the CDC and federal and state organizations. These policies and algorithms were followed during the patient's care in the ED.    Patient presents with nausea and vomiting the setting of  pregnancy.  This is most concerning for hyperemesis gravidarum.  Patient was already seen once early October for similar.  Will get labs to evaluate for AKI, electrolyte abnormalities, dehydration.  Also get urine evaluate for UTI.  Denies any new sexual contact but will check gonorrhea chlamydia test.  Patient noted to have elevated LFTs so ultrasound done to evaluate for retained stone.  Bedside ultrasound shows a live fetus that is moving around.  Patient's LFTs slightly elevated so I obtained an ultrasound to evaluate for cholecystitis.  Patient was noted to have a 3.5 cm solid hyperechoic mass in the left hepatic lobe that is new from prior.  They recommended a follow-up MRI in 2 to 3 months.  I discussed this with patient and gave her a handout.   1:24 PM reevaluated patient.  Patient says that her biggest issue is the difficulty with smell.  I offered patient admission for hyperemesis gravidarum but patient declined saying that she is other kids that she needs to go home to.  She would like to try another medication.  We will try low-dose of Reglan.  Patient did have elevated beta hydroxybutyrate and urine with ketones.  2 L of fluid and as I mentioned above she declines admission  Patient wet prep was concerning for bacterial vaginosis.  I discussed with patient metronidazole pros and cons and the low risk of harm to baby. She is okay with doing a course.  Will prescribe 250 p.o. every 8 for 7 days.  UA without evidence of bacteriuria.  Will send for culture.  Reevaluated patient and she is feeling better after the Reglan.  She is requesting discharge home.  Will prescribe a course of Reglan to take at home.  Patient understand that she should return to the ER if she continues to have vomiting and unable to take p.o. patient expressed understanding.   ____________________________________________   FINAL CLINICAL IMPRESSION(S) / ED DIAGNOSES   Final diagnoses:  Upper abdominal pain   Hyperemesis gravidarum  Bacterial vaginosis in pregnancy      MEDICATIONS GIVEN DURING THIS VISIT:  Medications  sodium chloride 0.9 % bolus 1,000 mL (0 mLs Intravenous Stopped 07/29/19 1223)  ondansetron (ZOFRAN) injection 4 mg (4 mg Intravenous Given 07/29/19 1108)  dextrose 5 % and 0.9% NaCl 5-0.9 % bolus 1,000 mL (1,000 mLs Intravenous New Bag/Given 07/29/19 1222)  metoCLOPramide (REGLAN) injection 10 mg (10 mg Intravenous Given 07/29/19 1329)     ED Discharge Orders         Ordered    metoCLOPramide (REGLAN) 10 MG tablet  3 times daily with meals,   Status:  Discontinued     07/29/19 1516    metoCLOPramide (REGLAN) 10 MG tablet  Every 8 hours PRN  07/29/19 1518    metroNIDAZOLE (FLAGYL) 250 MG tablet  3 times daily     07/29/19 1519           Note:  This document was prepared using Dragon voice recognition software and may include unintentional dictation errors.   Vanessa Peterson, MD 07/29/19 1520

## 2019-07-29 NOTE — ED Notes (Signed)
ED Provider at bedside. 

## 2019-07-29 NOTE — ED Triage Notes (Addendum)
Pt states she is [redacted] weeks pregnant and is taking antiemetic meds with no relief and states she has not been able to eat anything due to N/V for the past 2 days with lower abd cramping and yellow discharge

## 2019-07-29 NOTE — ED Notes (Signed)
Patient transported to Ultrasound 

## 2019-07-29 NOTE — ED Notes (Signed)
EDP using [place] at bedside.

## 2019-07-29 NOTE — ED Notes (Addendum)
Awaiting fluids from pharmacy 

## 2019-07-29 NOTE — Discharge Instructions (Addendum)
Your labs were concerning for dehydration secondary to pregnancy.  We are starting you on Reglan to help.  We also found bacterial vaginosis.  We prescribed you antibiotic to help with that as well called Flagyl.  Please return to the ER if you are unable to tolerate p.o. or any other concerns    1. No acute abnormality. 2. 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 Differential considerations include a hemangioma, hepatic adenoma, or less likely focal nodular hyperplasia. Given the patient is pregnant and gadolinium contrast can therefore not be administered, a follow-up noncontrast MRI of the abdomen in 2-3 months is recommended to evaluate for lesion growth, which would suggest a hepatic adenoma since these are hormonally active. Stability in size would be more suggestive of a hemangioma or focal nodular hyperplasia.

## 2019-07-30 LAB — URINE CULTURE: Culture: <10000 — AB

## 2019-07-31 LAB — GC/CHLAMYDIA PROBE AMP
Chlamydia trachomatis, NAA: NEGATIVE
Neisseria Gonorrhoeae by PCR: NEGATIVE

## 2019-08-01 ENCOUNTER — Other Ambulatory Visit: Payer: Self-pay

## 2019-08-01 ENCOUNTER — Emergency Department
Admission: EM | Admit: 2019-08-01 | Discharge: 2019-08-01 | Disposition: A | Discharge status: Home / Self Care | Payer: 59 | Attending: Emergency Medicine | Admitting: Emergency Medicine

## 2019-08-01 ENCOUNTER — Encounter: Payer: Self-pay | Admitting: Emergency Medicine

## 2019-08-01 DIAGNOSIS — Z3A00 Weeks of gestation of pregnancy not specified: Secondary | ICD-10-CM | POA: Diagnosis not present

## 2019-08-01 DIAGNOSIS — Z79899 Other long term (current) drug therapy: Secondary | ICD-10-CM | POA: Insufficient documentation

## 2019-08-01 DIAGNOSIS — O218 Other vomiting complicating pregnancy: Secondary | ICD-10-CM | POA: Diagnosis present

## 2019-08-01 DIAGNOSIS — O210 Mild hyperemesis gravidarum: Secondary | ICD-10-CM

## 2019-08-01 DIAGNOSIS — J45909 Unspecified asthma, uncomplicated: Secondary | ICD-10-CM | POA: Diagnosis not present

## 2019-08-01 LAB — TROPONIN I (HIGH SENSITIVITY)
Troponin I (High Sensitivity): <2 ng/L (ref <18)
Troponin I (High Sensitivity): 3 ng/L (ref <18)

## 2019-08-01 LAB — URINALYSIS, COMPLETE (UACMP) WITH MICROSCOPIC
Bacteria, UA: NONE SEEN
Bilirubin Urine: NEGATIVE
Glucose, UA: NEGATIVE mg/dL
Hgb urine dipstick: NEGATIVE
Ketones, ur: 80 mg/dL — AB
Leukocytes,Ua: NEGATIVE
Nitrite: NEGATIVE
Protein, ur: 30 mg/dL — AB
Specific Gravity, Urine: 1.018 (ref 1.005–1.030)
pH: 5 (ref 5.0–8.0)

## 2019-08-01 LAB — COMPREHENSIVE METABOLIC PANEL
ALT: 77 U/L — ABNORMAL HIGH (ref 0–44)
AST: 35 U/L (ref 15–41)
Albumin: 3.9 g/dL (ref 3.5–5.0)
Alkaline Phosphatase: 118 U/L (ref 38–126)
Anion gap: 17 — ABNORMAL HIGH (ref 5–15)
BUN: <5 mg/dL — ABNORMAL LOW (ref 6–20)
CO2: 13 mmol/L — ABNORMAL LOW (ref 22–32)
Calcium: 9.1 mg/dL (ref 8.9–10.3)
Chloride: 102 mmol/L (ref 98–111)
Creatinine, Ser: 0.53 mg/dL (ref 0.44–1.00)
GFR calc Af Amer: >60 mL/min (ref >60)
GFR calc non Af Amer: >60 mL/min (ref >60)
Glucose, Bld: 91 mg/dL (ref 70–99)
Potassium: 3.2 mmol/L — ABNORMAL LOW (ref 3.5–5.1)
Sodium: 132 mmol/L — ABNORMAL LOW (ref 135–145)
Total Bilirubin: 2 mg/dL — ABNORMAL HIGH (ref 0.3–1.2)
Total Protein: 8.2 g/dL — ABNORMAL HIGH (ref 6.5–8.1)

## 2019-08-01 LAB — CBC
HCT: 37 % (ref 36.0–46.0)
Hemoglobin: 12.7 g/dL (ref 12.0–15.0)
MCH: 26.5 pg (ref 26.0–34.0)
MCHC: 34.3 g/dL (ref 30.0–36.0)
MCV: 77.1 fL — ABNORMAL LOW (ref 80.0–100.0)
Platelets: 356 10*3/uL (ref 150–400)
RBC: 4.8 MIL/uL (ref 3.87–5.11)
RDW: 13.5 % (ref 11.5–15.5)
WBC: 8.7 10*3/uL (ref 4.0–10.5)
nRBC: 0 % (ref 0.0–0.2)

## 2019-08-01 LAB — HCG, QUANTITATIVE, PREGNANCY: hCG, Beta Chain, Quant, S: 40248 m[IU]/mL — ABNORMAL HIGH (ref <5)

## 2019-08-01 LAB — LIPASE, BLOOD: Lipase: 64 U/L — ABNORMAL HIGH (ref 11–51)

## 2019-08-01 MED ORDER — SODIUM CHLORIDE 0.9 % IV BOLUS
1000.0000 mL | Freq: Once | INTRAVENOUS | Status: AC
  Administered 2019-08-01: 1000 mL via INTRAVENOUS

## 2019-08-01 MED ORDER — POTASSIUM CHLORIDE CRYS ER 20 MEQ PO TBCR
40.0000 meq | EXTENDED_RELEASE_TABLET | Freq: Once | ORAL | Status: AC
  Administered 2019-08-01: 40 meq via ORAL
  Filled 2019-08-01: qty 2

## 2019-08-01 MED ORDER — ONDANSETRON HCL 4 MG/2ML IJ SOLN
4.0000 mg | Freq: Once | INTRAMUSCULAR | Status: AC
  Administered 2019-08-01: 4 mg via INTRAVENOUS
  Filled 2019-08-01: qty 2

## 2019-08-01 MED ORDER — METOCLOPRAMIDE HCL 5 MG/ML IJ SOLN
10.0000 mg | Freq: Once | INTRAMUSCULAR | Status: AC
  Administered 2019-08-01: 10 mg via INTRAVENOUS
  Filled 2019-08-01: qty 2

## 2019-08-01 MED ORDER — LIDOCAINE VISCOUS HCL 2 % MT SOLN
15.0000 mL | Freq: Once | OROMUCOSAL | Status: AC
  Administered 2019-08-01: 15 mL via ORAL
  Filled 2019-08-01: qty 15

## 2019-08-01 MED ORDER — SODIUM CHLORIDE 0.9% FLUSH: 3.0000 mL | Freq: Once | INTRAVENOUS | Status: DC

## 2019-08-01 MED ORDER — ALUM & MAG HYDROXIDE-SIMETH 200-200-20 MG/5ML PO SUSP
30.0000 mL | Freq: Once | ORAL | Status: AC
  Administered 2019-08-01: 30 mL via ORAL
  Filled 2019-08-01: qty 30

## 2019-08-01 NOTE — ED Notes (Signed)
Pt attempted to drink GI cocktail and vomited it up. Dr. Archie Balboa aware.

## 2019-08-01 NOTE — ED Notes (Signed)
Pt ambulated to the restroom.

## 2019-08-01 NOTE — Discharge Instructions (Addendum)
Please seek medical attention for any high fevers, chest pain, shortness of breath, change in behavior, persistent vomiting, bloody stool or any other new or concerning symptoms.  

## 2019-08-01 NOTE — ED Notes (Signed)
Pt states that nausea medication has helped and that she is feeling some better. Pt denies any needs at this time.

## 2019-08-01 NOTE — ED Provider Notes (Signed)
[facility] Emergency Department Provider Note   ____________________________________________   I have reviewed the triage vital signs and the nursing notes.   HISTORY  Chief Complaint Emesis and Weakness   History limited by: Not Limited   HPI Candice Moore is a 28 y.o. female who presents to the emergency department today because of concerns for continued nausea and vomiting in setting of early pregnancy.  Patient states she was seen in the emergency department just a few days ago.  She has tried the medications prescribed for her for nausea and will get some relief for an hour or so before the nausea comes back.  In addition to the nausea she has discomfort in her chest and throat.  She thinks this is due to acid reflux.  Patient denies any fevers.  Records reviewed. Per medical record review patient has a history of GERD, cholecystectomy.  Past Medical History:  Diagnosis Date  . Asthma   . GERD (gastroesophageal reflux disease)     Patient Active Problem List   Diagnosis Date Noted  . Postpartum care following vaginal delivery 11/04/2016  . Normal labor 11/02/2016    Past Surgical History:  Procedure Laterality Date  . CHOLECYSTECTOMY      Prior to Admission medications   Medication Sig Start Date End Date Taking? Authorizing Provider  metoCLOPramide (REGLAN) 10 MG tablet Take 1 tablet (10 mg total) by mouth every 8 (eight) hours as needed for nausea. 07/29/19 08/28/19  Concha Se, MD  metroNIDAZOLE (FLAGYL) 250 MG tablet Take 1 tablet (250 mg total) by mouth 3 (three) times daily for 7 days. 07/29/19 08/05/19  Concha Se, MD  ondansetron (ZOFRAN-ODT) 4 MG disintegrating tablet Take 4 mg by mouth every 8 (eight) hours as needed. 07/18/19   [provider]  Prenatal Vit-Fe Fumarate-FA (MULTIVITAMIN-PRENATAL) 27-0.8 MG TABS tablet Take 1 tablet by mouth daily at 12 noon.    [provider]  promethazine (PHENERGAN) 25 MG  tablet Take 12.5-25 mg by mouth every 6 (six) hours as needed. 07/23/19   [provider]    Allergies Patient has no known allergies.  Family History  Problem Relation Age of Onset  . Breast cancer Maternal Grandmother   . Colon cancer Maternal Grandfather     Social History Social History   Tobacco Use  . Smoking status: Never Smoker  . Smokeless tobacco: Never Used  Substance Use Topics  . Alcohol use: No  . Drug use: No    Review of Systems Constitutional: No fever/chills Eyes: No visual changes. ENT: Positive for sore throat. Cardiovascular: Positive for chest discomfort.  Respiratory: Denies shortness of breath. Gastrointestinal: Positive for abdominal cramping, nausea and vomiting.  Genitourinary: Negative for dysuria. Musculoskeletal: Negative for back pain. Skin: Negative for rash. Neurological: Negative for headaches, focal weakness or numbness.  ____________________________________________   PHYSICAL EXAM:  VITAL SIGNS: ED Triage Vitals  Enc Vitals Group     BP 08/01/19 1159 98/71     Pulse Rate 08/01/19 1159 98     Resp 08/01/19 1159 16     Temp 08/01/19 1159 98.9 F (37.2 C)     Temp Source 08/01/19 1159 Oral     SpO2 08/01/19 1159 98 %     Weight 08/01/19 1206 231 lb (104.8 kg)     Height 08/01/19 1206 5\' 2"  (1.575 m)     Head Circumference --      Peak Flow --      Pain Score  08/01/19 1206 6   Constitutional: Alert and oriented.  Eyes: Conjunctivae are normal.  ENT      Head: Normocephalic and atraumatic.      Nose: No congestion/rhinnorhea.      Mouth/Throat: Mucous membranes are moist.      Neck: No stridor. Hematological/Lymphatic/Immunilogical: No cervical lymphadenopathy. Cardiovascular: Normal rate, regular rhythm.  No murmurs, rubs, or gallops.  Respiratory: Normal respiratory effort without tachypnea nor retractions. Breath sounds are clear and equal bilaterally. No wheezes/rales/rhonchi. Gastrointestinal: Soft and  slightly diffusely tender to palpation. No rebound. No guarding.  Genitourinary: Deferred Musculoskeletal: Normal range of motion in all extremities. No lower extremity edema. Neurologic:  Normal speech and language. No gross focal neurologic deficits are appreciated.  Skin:  Skin is warm, dry and intact. No rash noted. Psychiatric: Mood and affect are normal. Speech and behavior are normal. Patient exhibits appropriate insight and judgment.  ____________________________________________    LABS (pertinent positives/negatives)  Trop <2 Lipase 64 CBC wbc 8.7, hgb 12.7, plt 356 CMP na 132, k 3.2, cr 0.53  ____________________________________________   EKG  I, Nance Pear, attending physician, personally viewed and interpreted this EKG  EKG Time: 1205 Rate: 100 Rhythm: normal sinus rhythm Axis: rightward axis Intervals: qtc 446 QRS: narrow ST changes: no st elevation Impression: abnormal ekg  ____________________________________________    RADIOLOGY  None  ____________________________________________   PROCEDURES  Procedures  ____________________________________________   INITIAL IMPRESSION / ASSESSMENT AND PLAN / ED COURSE  Pertinent labs & imaging results that were available during my care of the patient were reviewed by me and considered in my medical decision making (see chart for details).   Patient presented to the emergency department today because of concerns for continued nausea and vomiting in the setting of early pregnancy.  Patient's blood work and urine is consistent with nausea vomiting and some dehydration.  Patient was given IV fluids and antiemetics here in the emergency department.  She states she did feel better and she did want to try continue treating this at her house.  She is working on establishing care with OB/GYN.  Discussed that patient can return if symptoms return.  She does have antiemetics already prescribed to  her. ____________________________________________   FINAL CLINICAL IMPRESSION(S) / ED DIAGNOSES  Final diagnoses:  Hyperemesis gravidarum     Note: This dictation was prepared with Dragon dictation. Any transcriptional errors that result from this process are unintentional     Nance Pear, MD 08/01/19 1954

## 2019-08-01 NOTE — ED Notes (Signed)
ED Provider at bedside. 

## 2019-08-01 NOTE — ED Notes (Addendum)
Pt reports having N/V since finding out she was pregnant. Pt states that she is [redacted] weeks pregnant. Has not seen OB yet. It is G3P2. Pt reports 5-6 episodes of vomiting in the last 24 hours as well as generalized weakness.   Pt seen 3 days ago for same.

## 2019-08-01 NOTE — ED Triage Notes (Signed)
Patient states near syncopal episode in lobby. Brought in for EKG and complete triage. States chest pain began yesterday.

## 2019-08-01 NOTE — ED Triage Notes (Signed)
First nurse note Pt states that she is [redacted] weeks pregnant and has been unable to hold anything down, pt asking for a wheelchair and appears like she is weak

## 2019-08-06 ENCOUNTER — Inpatient Hospital Stay
Admission: EM | Admit: 2019-08-06 | Discharge: 2019-08-09 | DRG: 833 | Disposition: A | Discharge status: Home / Self Care | Payer: 59 | Attending: Obstetrics and Gynecology | Admitting: Obstetrics and Gynecology

## 2019-08-06 ENCOUNTER — Emergency Department: Payer: 59

## 2019-08-06 ENCOUNTER — Other Ambulatory Visit: Payer: Self-pay

## 2019-08-06 DIAGNOSIS — O26891 Other specified pregnancy related conditions, first trimester: Secondary | ICD-10-CM | POA: Diagnosis present

## 2019-08-06 DIAGNOSIS — Z3A13 13 weeks gestation of pregnancy: Secondary | ICD-10-CM

## 2019-08-06 DIAGNOSIS — O211 Hyperemesis gravidarum with metabolic disturbance: Principal | ICD-10-CM | POA: Diagnosis present

## 2019-08-06 DIAGNOSIS — Z9049 Acquired absence of other specified parts of digestive tract: Secondary | ICD-10-CM

## 2019-08-06 DIAGNOSIS — Z3492 Encounter for supervision of normal pregnancy, unspecified, second trimester: Secondary | ICD-10-CM

## 2019-08-06 DIAGNOSIS — O99211 Obesity complicating pregnancy, first trimester: Secondary | ICD-10-CM | POA: Diagnosis present

## 2019-08-06 DIAGNOSIS — K5900 Constipation, unspecified: Secondary | ICD-10-CM | POA: Diagnosis present

## 2019-08-06 DIAGNOSIS — Z23 Encounter for immunization: Secondary | ICD-10-CM

## 2019-08-06 DIAGNOSIS — R748 Abnormal levels of other serum enzymes: Secondary | ICD-10-CM

## 2019-08-06 DIAGNOSIS — O210 Mild hyperemesis gravidarum: Secondary | ICD-10-CM

## 2019-08-06 DIAGNOSIS — E876 Hypokalemia: Secondary | ICD-10-CM

## 2019-08-06 DIAGNOSIS — R1084 Generalized abdominal pain: Secondary | ICD-10-CM

## 2019-08-06 DIAGNOSIS — Z20828 Contact with and (suspected) exposure to other viral communicable diseases: Secondary | ICD-10-CM | POA: Diagnosis present

## 2019-08-06 LAB — COMPREHENSIVE METABOLIC PANEL
ALT: 145 U/L — ABNORMAL HIGH (ref 0–44)
AST: 65 U/L — ABNORMAL HIGH (ref 15–41)
Albumin: 3.9 g/dL (ref 3.5–5.0)
Alkaline Phosphatase: 145 U/L — ABNORMAL HIGH (ref 38–126)
Anion gap: 15 (ref 5–15)
BUN: 7 mg/dL (ref 6–20)
CO2: 13 mmol/L — ABNORMAL LOW (ref 22–32)
Calcium: 9.4 mg/dL (ref 8.9–10.3)
Chloride: 105 mmol/L (ref 98–111)
Creatinine, Ser: 0.49 mg/dL (ref 0.44–1.00)
GFR calc Af Amer: >60 mL/min (ref >60)
GFR calc non Af Amer: >60 mL/min (ref >60)
Glucose, Bld: 106 mg/dL — ABNORMAL HIGH (ref 70–99)
Potassium: 2.6 mmol/L — CL (ref 3.5–5.1)
Sodium: 133 mmol/L — ABNORMAL LOW (ref 135–145)
Total Bilirubin: 2.1 mg/dL — ABNORMAL HIGH (ref 0.3–1.2)
Total Protein: 8.6 g/dL — ABNORMAL HIGH (ref 6.5–8.1)

## 2019-08-06 LAB — URINALYSIS, COMPLETE (UACMP) WITH MICROSCOPIC
Glucose, UA: NEGATIVE mg/dL
Hgb urine dipstick: NEGATIVE
Ketones, ur: 80 mg/dL — AB
Leukocytes,Ua: NEGATIVE
Nitrite: NEGATIVE
Protein, ur: 100 mg/dL — AB
Specific Gravity, Urine: 1.026 (ref 1.005–1.030)
pH: 6 (ref 5.0–8.0)

## 2019-08-06 LAB — WET PREP, GENITAL
Clue Cells Wet Prep HPF POC: NONE SEEN
Sperm: NONE SEEN
Trich, Wet Prep: NONE SEEN
Yeast Wet Prep HPF POC: NONE SEEN

## 2019-08-06 LAB — PREGNANCY, URINE: Preg Test, Ur: POSITIVE — AB

## 2019-08-06 LAB — CBC
HCT: 39.6 % (ref 36.0–46.0)
Hemoglobin: 13.6 g/dL (ref 12.0–15.0)
MCH: 26.7 pg (ref 26.0–34.0)
MCHC: 34.3 g/dL (ref 30.0–36.0)
MCV: 77.6 fL — ABNORMAL LOW (ref 80.0–100.0)
Platelets: 377 10*3/uL (ref 150–400)
RBC: 5.1 MIL/uL (ref 3.87–5.11)
RDW: 13.7 % (ref 11.5–15.5)
WBC: 9 10*3/uL (ref 4.0–10.5)
nRBC: 0 % (ref 0.0–0.2)

## 2019-08-06 LAB — SARS CORONAVIRUS 2 (TAT 6-24 HRS): SARS Coronavirus 2: NEGATIVE

## 2019-08-06 LAB — TSH: TSH: 2.536 u[IU]/mL (ref 0.350–4.500)

## 2019-08-06 LAB — LIPASE, BLOOD: Lipase: 106 U/L — ABNORMAL HIGH (ref 11–51)

## 2019-08-06 LAB — HCG, QUANTITATIVE, PREGNANCY: hCG, Beta Chain, Quant, S: 34483 m[IU]/mL — ABNORMAL HIGH (ref <5)

## 2019-08-06 MED ORDER — METHYLPREDNISOLONE 4 MG PO TABS: 8.0000 mg | ORAL_TABLET | Freq: Every day | ORAL | Status: DC

## 2019-08-06 MED ORDER — SODIUM CHLORIDE 0.9% FLUSH: 3.0000 mL | Freq: Once | INTRAVENOUS | Status: DC

## 2019-08-06 MED ORDER — ONDANSETRON HCL 4 MG/2ML IJ SOLN
4.0000 mg | Freq: Three times a day (TID) | INTRAMUSCULAR | Status: DC | PRN
  Administered 2019-08-06 – 2019-08-07 (×3): 4 mg via INTRAVENOUS
  Filled 2019-08-06 (×3): qty 2

## 2019-08-06 MED ORDER — METHYLPREDNISOLONE 4 MG PO TABS
16.0000 mg | ORAL_TABLET | Freq: Every day | ORAL | Status: AC
  Administered 2019-08-08: 16 mg via ORAL
  Filled 2019-08-06: qty 4
  Filled 2019-08-06: qty 1

## 2019-08-06 MED ORDER — METHYLPREDNISOLONE 4 MG PO TABS
8.0000 mg | ORAL_TABLET | Freq: Every day | ORAL | Status: DC
  Filled 2019-08-06: qty 2

## 2019-08-06 MED ORDER — FAMOTIDINE IN NACL 20-0.9 MG/50ML-% IV SOLN
20.0000 mg | Freq: Two times a day (BID) | INTRAVENOUS | Status: DC
  Administered 2019-08-06 – 2019-08-07 (×3): 20 mg via INTRAVENOUS
  Filled 2019-08-06 (×8): qty 50

## 2019-08-06 MED ORDER — ZOLPIDEM TARTRATE 5 MG PO TABS
5.0000 mg | ORAL_TABLET | Freq: Every evening | ORAL | Status: DC | PRN
  Filled 2019-08-06: qty 1

## 2019-08-06 MED ORDER — METOCLOPRAMIDE HCL 5 MG/ML IJ SOLN
10.0000 mg | Freq: Four times a day (QID) | INTRAMUSCULAR | Status: DC
  Administered 2019-08-06 – 2019-08-07 (×5): 10 mg via INTRAVENOUS
  Filled 2019-08-06 (×5): qty 2

## 2019-08-06 MED ORDER — POTASSIUM CHLORIDE CRYS ER 20 MEQ PO TBCR
40.0000 meq | EXTENDED_RELEASE_TABLET | Freq: Once | ORAL | Status: AC
  Administered 2019-08-06: 40 meq via ORAL
  Filled 2019-08-06: qty 2

## 2019-08-06 MED ORDER — METOCLOPRAMIDE HCL 10 MG PO TABS
10.0000 mg | ORAL_TABLET | Freq: Four times a day (QID) | ORAL | Status: DC
  Administered 2019-08-07 – 2019-08-09 (×8): 10 mg via ORAL
  Filled 2019-08-06 (×8): qty 1

## 2019-08-06 MED ORDER — METHYLPREDNISOLONE 4 MG PO TABS
16.0000 mg | ORAL_TABLET | Freq: Every day | ORAL | Status: DC
  Administered 2019-08-07 – 2019-08-09 (×3): 16 mg via ORAL
  Filled 2019-08-06: qty 4
  Filled 2019-08-06: qty 1
  Filled 2019-08-06: qty 4

## 2019-08-06 MED ORDER — METHYLPREDNISOLONE SODIUM SUCC 125 MG IJ SOLR
48.0000 mg | Freq: Once | INTRAMUSCULAR | Status: AC
  Administered 2019-08-06: 48 mg via INTRAVENOUS
  Filled 2019-08-06: qty 2

## 2019-08-06 MED ORDER — AZITHROMYCIN 500 MG PO TABS
1000.0000 mg | ORAL_TABLET | Freq: Once | ORAL | Status: AC
  Administered 2019-08-06: 1000 mg via ORAL
  Filled 2019-08-06: qty 2

## 2019-08-06 MED ORDER — HYDROXYZINE HCL 50 MG/ML IM SOLN
50.0000 mg | Freq: Four times a day (QID) | INTRAMUSCULAR | Status: DC | PRN
  Filled 2019-08-06: qty 1

## 2019-08-06 MED ORDER — POTASSIUM CHLORIDE 2 MEQ/ML IV SOLN
INTRAVENOUS | Status: DC
  Administered 2019-08-06 – 2019-08-08 (×3): via INTRAVENOUS
  Filled 2019-08-06 (×9): qty 1000

## 2019-08-06 MED ORDER — ACETAMINOPHEN 325 MG PO TABS: 650.0000 mg | ORAL_TABLET | ORAL | Status: DC | PRN

## 2019-08-06 MED ORDER — SODIUM CHLORIDE 0.9 % IV SOLN
1.0000 g | Freq: Once | INTRAVENOUS | Status: AC
  Administered 2019-08-06: 1 g via INTRAVENOUS
  Filled 2019-08-06: qty 10

## 2019-08-06 MED ORDER — DIPHENHYDRAMINE HCL 50 MG/ML IJ SOLN
25.0000 mg | Freq: Once | INTRAMUSCULAR | Status: AC
  Administered 2019-08-06: 25 mg via INTRAVENOUS
  Filled 2019-08-06: qty 1

## 2019-08-06 MED ORDER — THIAMINE HCL 100 MG/ML IJ SOLN
INTRAVENOUS | Status: AC
  Administered 2019-08-06 – 2019-08-08 (×3): via INTRAVENOUS
  Filled 2019-08-06 (×5): qty 1000

## 2019-08-06 MED ORDER — PRENATAL MULTIVITAMIN CH
1.0000 | ORAL_TABLET | Freq: Every day | ORAL | Status: DC
  Administered 2019-08-08: 1 via ORAL
  Filled 2019-08-06 (×2): qty 1

## 2019-08-06 MED ORDER — METHYLPREDNISOLONE 4 MG PO TABS
4.0000 mg | ORAL_TABLET | Freq: Every day | ORAL | Status: DC
  Filled 2019-08-06: qty 1

## 2019-08-06 MED ORDER — METOCLOPRAMIDE HCL 5 MG/ML IJ SOLN
10.0000 mg | Freq: Once | INTRAMUSCULAR | Status: AC
  Administered 2019-08-06: 10 mg via INTRAVENOUS
  Filled 2019-08-06: qty 2

## 2019-08-06 MED ORDER — DEXTROSE-NACL 5-0.45 % IV SOLN
Freq: Once | INTRAVENOUS | Status: AC
  Administered 2019-08-06: 03:00:00 via INTRAVENOUS

## 2019-08-06 MED ORDER — SODIUM CHLORIDE 0.9 % IV BOLUS
1000.0000 mL | Freq: Once | INTRAVENOUS | Status: AC
  Administered 2019-08-06: 1000 mL via INTRAVENOUS

## 2019-08-06 MED ORDER — METHYLPREDNISOLONE 4 MG PO TABS: 4.0000 mg | ORAL_TABLET | Freq: Every day | ORAL | Status: DC

## 2019-08-06 MED ORDER — FAMOTIDINE 20 MG PO TABS
20.0000 mg | ORAL_TABLET | Freq: Two times a day (BID) | ORAL | Status: DC
  Administered 2019-08-07 – 2019-08-09 (×4): 20 mg via ORAL
  Filled 2019-08-06 (×4): qty 1

## 2019-08-06 MED ORDER — METHYLPREDNISOLONE 4 MG PO TABS
16.0000 mg | ORAL_TABLET | Freq: Every day | ORAL | Status: DC
  Administered 2019-08-07 – 2019-08-08 (×2): 16 mg via ORAL
  Filled 2019-08-06 (×2): qty 1
  Filled 2019-08-06 (×3): qty 4

## 2019-08-06 MED ORDER — PYRIDOXINE HCL 100 MG/ML IJ SOLN
100.0000 mg | Freq: Every day | INTRAMUSCULAR | Status: DC
  Administered 2019-08-06 – 2019-08-09 (×4): 100 mg via INTRAVENOUS
  Filled 2019-08-06 (×6): qty 1

## 2019-08-06 NOTE — ED Provider Notes (Signed)
[facility] Emergency Department Provider Note  ____________________________________________  Time seen: Approximately 2:46 AM  I have reviewed the triage vital signs and the nursing notes.   HISTORY  Chief Complaint Emesis    HPI Candice Moore is a 28 y.o. female with a history of asthma and GERD, currently reporting being about 13 or [redacted] weeks pregnant, stating that she has been unable to eat for the past 3 months, feels weak.  Also complains of pelvic pressure which radiates to her back, constant, no aggravating or alleviating factors and associated vaginal discharge.  Denies bleeding.  Has been getting prenatal care at [facility] and [facility] and has an appointment coming up in about 2 weeks.  Reports that she had an ultrasound done through [facility] when she was [redacted] weeks pregnant.  Denies any fluid leakage.  Denies contractions.  No fevers or chills.  Denies dysuria.      Past Medical History:  Diagnosis Date  . Asthma   . GERD (gastroesophageal reflux disease)      Patient Active Problem List   Diagnosis Date Noted  . Postpartum care following vaginal delivery 11/04/2016  . Normal labor 11/02/2016     Past Surgical History:  Procedure Laterality Date  . CHOLECYSTECTOMY       Prior to Admission medications   Medication Sig Start Date End Date Taking? Authorizing Provider  metoCLOPramide (REGLAN) 10 MG tablet Take 1 tablet (10 mg total) by mouth every 8 (eight) hours as needed for nausea. 07/29/19 08/28/19  Concha SeFunke, Mary E, MD  ondansetron (ZOFRAN-ODT) 4 MG disintegrating tablet Take 4 mg by mouth every 8 (eight) hours as needed. 07/18/19   [provider]  Prenatal Vit-Fe Fumarate-FA (MULTIVITAMIN-PRENATAL) 27-0.8 MG TABS tablet Take 1 tablet by mouth daily at 12 noon.    [provider]  promethazine (PHENERGAN) 25 MG tablet Take 12.5-25 mg by mouth every 6 (six) hours as needed. 07/23/19    [provider]     Allergies Patient has no known allergies.   Family History  Problem Relation Age of Onset  . Breast cancer Maternal Grandmother   . Colon cancer Maternal Grandfather     Social History Social History   Tobacco Use  . Smoking status: Never Smoker  . Smokeless tobacco: Never Used  Substance Use Topics  . Alcohol use: No  . Drug use: No    Review of Systems  Constitutional:   No fever or chills.  ENT:   No sore throat. No rhinorrhea. Cardiovascular:   No chest pain or syncope. Respiratory:   No dyspnea or cough. Gastrointestinal:   Positive as above for lower abdominal pain and vomiting. Musculoskeletal:   Negative for focal pain or swelling All other systems reviewed and are negative except as documented above in ROS and HPI.  ____________________________________________   PHYSICAL EXAM:  VITAL SIGNS: ED Triage Vitals  Enc Vitals Group     BP 08/06/19 0201 112/77     Pulse Rate 08/06/19 0201 (!) 123     Resp 08/06/19 0201 17     Temp 08/06/19 0201 98.9 F (37.2 C)     Temp Source 08/06/19 0201 Oral     SpO2 08/06/19 0201 100 %     Weight 08/06/19 0202 221 lb (100.2 kg)     Height 08/06/19 0202 5\' 2"  (1.575 m)     Head Circumference --      Peak Flow --  Pain Score 08/06/19 0202 8     Pain Loc --      Pain Edu? --      Excl. in [place]? --     Vital signs reviewed, nursing assessments reviewed.   Constitutional:   Alert and oriented. Non-toxic appearance. Eyes:   Conjunctivae are normal. EOMI. PERRL. ENT      Head:   Normocephalic and atraumatic.      Nose:   Wearing a mask.      Mouth/Throat:   Wearing a mask.      Neck:   No meningismus. Full ROM. Hematological/Lymphatic/Immunilogical:   No cervical lymphadenopathy. Cardiovascular:   RRR. Symmetric bilateral radial and DP pulses.  No murmurs. Cap refill less than 2 seconds. Respiratory:   Normal respiratory effort without tachypnea/retractions. Breath sounds are  clear and equal bilaterally. No wheezes/rales/rhonchi. Gastrointestinal:   Soft with diffuse lower abdominal tenderness. Non distended. There is no CVA tenderness.  No rebound, rigidity, or guarding. Genitourinary:   Performed with nurse Caryl Pina at bedside.  External exam unremarkable.  Sterile speculum exam reveals thick white discharge.  Bimanual exam with sterile gloves positive for CMT and left adnexal tenderness. Musculoskeletal:   Normal range of motion in all extremities. No joint effusions.  No lower extremity tenderness.  No edema. Neurologic:   Normal speech and language.  Motor grossly intact. No acute focal neurologic deficits are appreciated.  Skin:    Skin is warm, dry and intact. No rash noted.  No petechiae, purpura, or bullae.  ____________________________________________    LABS (pertinent positives/negatives) (all labs ordered are listed, but only abnormal results are displayed) Labs Reviewed  WET PREP, GENITAL - Abnormal; Notable for the following components:      Result Value   WBC, Wet Prep HPF POC FEW (*)    All other components within normal limits  LIPASE, BLOOD - Abnormal; Notable for the following components:   Lipase 106 (*)    All other components within normal limits  COMPREHENSIVE METABOLIC PANEL - Abnormal; Notable for the following components:   Sodium 133 (*)    Potassium 2.6 (*)    CO2 13 (*)    Glucose, Bld 106 (*)    Total Protein 8.6 (*)    AST 65 (*)    ALT 145 (*)    Alkaline Phosphatase 145 (*)    Total Bilirubin 2.1 (*)    All other components within normal limits  CBC - Abnormal; Notable for the following components:   MCV 77.6 (*)    All other components within normal limits  URINALYSIS, COMPLETE (UACMP) WITH MICROSCOPIC - Abnormal; Notable for the following components:   Color, Urine AMBER (*)    APPearance HAZY (*)    Bilirubin Urine SMALL (*)    Ketones, ur 80 (*)    Protein, ur 100 (*)    Bacteria, UA RARE (*)    All other  components within normal limits  HCG, QUANTITATIVE, PREGNANCY - Abnormal; Notable for the following components:   hCG, Beta Chain, Quant, S 34,483 (*)    All other components within normal limits  PREGNANCY, URINE - Abnormal; Notable for the following components:   Preg Test, Ur POSITIVE (*)    All other components within normal limits  GC/CHLAMYDIA PROBE AMP  SARS CORONAVIRUS 2 (TAT 6-24 HRS)   ____________________________________________   EKG    ____________________________________________    RADIOLOGY  US Ob Comp Less 14 Wks  Result Date: 08/06/2019 CLINICAL DATA:  Generalized  abdominal pain. EXAM: OBSTETRIC <14 WK ULTRASOUND TECHNIQUE: Transabdominal ultrasound was performed for evaluation of the gestation as well as the maternal uterus and adnexal regions. COMPARISON:  Ultrasound 06/23/2019. FINDINGS: Intrauterine gestational sac: Single Yolk sac:  Not visualized Embryo:  Visualized Cardiac Activity: Visualized Heart Rate: 47 bpm CRL: 78.9 mm   13 w 6 d                  [place] EDC: 02/05/2020 Subchorionic hemorrhage:  None visualized Maternal uterus/adnexae: Braxton Hicks contraction noted. No other abnormality identified. IMPRESSION: Single viable intrauterine pregnancy at 13 weeks 6 days. Braxton Hicks contraction noted. No subchorionic hemorrhage noted on today's exam as noted on prior study of 06/23/2019. Electronically Signed   By: Maisie Fus  Register   On: 08/06/2019 05:56   US Abdomen Limited Ruq  Result Date: 08/06/2019 CLINICAL DATA:  Generalized abdominal pain. EXAM: ULTRASOUND ABDOMEN LIMITED RIGHT UPPER QUADRANT COMPARISON:  Ultrasound 07/29/2019.  CT 08/25/2014. FINDINGS: Gallbladder: Cholecystectomy. Common bile duct: Diameter: 3.8 mm Liver: A 2.5 x 2.5 x 3.7 cm hyperechoic well-circumscribed mass is noted in the left hepatic lobe. Similar findings noted on prior exam. This suggests that this lesion is a hemangioma. Again hepatic adenoma and focal nodular hyperplasia could  present in this fashion. Given stability cavernous hemangioma is again favored. Normal hepatic parenchymal echogenicity. Portal vein is patent on color Doppler imaging with normal direction of blood flow towards the liver. Other: None. IMPRESSION: 1. A 2.5 x 2.5 x 3.7 cm hyperechoic well-circumscribed masses in the left hepatic lobe. Similar findings noted on prior exam. 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 As noted on prior study hepatic adenoma and focal nodular hyperplasia could present in this fashion. Again given stability cavernous hemangiomas favored. Given the patient's pregnancy and as noted on prior report noncontrast MRI of the abdomen should be considered for further evaluation. 2.  Prior cholecystectomy.  No biliary distention. Electronically Signed   By: Maisie Fus  Register   On: 08/06/2019 05:49    ____________________________________________   PROCEDURES Procedures  ____________________________________________    CLINICAL IMPRESSION / ASSESSMENT AND PLAN / ED COURSE  Medications ordered in the ED: Medications  dextrose 5 %-0.45 % sodium chloride infusion ( Intravenous Stopped 08/06/19 0540)  metoCLOPramide (REGLAN) injection 10 mg (10 mg Intravenous Given 08/06/19 0255)  diphenhydrAMINE (BENADRYL) injection 25 mg (25 mg Intravenous Given 08/06/19 0255)  potassium chloride SA (KLOR-CON) CR tablet 40 mEq (40 mEq Oral Given 08/06/19 0348)  cefTRIAXone (ROCEPHIN) 1 g in sodium chloride 0.9 % 100 mL IVPB (0 g Intravenous Stopped 08/06/19 0540)  azithromycin (ZITHROMAX) tablet 1,000 mg (1,000 mg Oral Given 08/06/19 0408)  sodium chloride 0.9 % bolus 1,000 mL (1,000 mLs Intravenous New Bag/Given 08/06/19 0540)    Pertinent labs & imaging results that were available during my care of the patient were reviewed by me and considered in my medical decision making (see chart for details).  Candice Moore was evaluated in Emergency Department on 08/06/2019 for the  symptoms described in the history of present illness. She was evaluated in the context of the global COVID-19 pandemic, which necessitated consideration that the patient might be at risk for infection with the SARS-CoV-2 virus that causes COVID-19. Institutional protocols and algorithms that pertain to the evaluation of patients at risk for COVID-19 are in a state of rapid change based on information released by regulatory bodies including the CDC and federal and state organizations. These policies and algorithms were followed during the  patient's care in the ED.   Patient presents with low abdominal pain, reports vaginal discharge, starting her second trimester pregnancy.  Review of electronic medical record shows multiple visits recently for hyperemesis symptoms.  I will give IV fluids, antiemetics.  Pelvic exam, check labs.  Vital signs reveal tachycardia most likely due to dehydration.  Low suspicion for PID TOA torsion ectopic or appendicitis.  Clinical Course as of Aug 05 642  Thu Aug 06, 2019  1610 Labs show elevated LFTs, low bicarb, hypokalemia, large ketones in the urine, elevated lipase.  Possibly due to viral syndrome versus PID versus hyperemesis and dehydration versus biliary disease.  Check pelvic and right upper quadrant ultrasounds, IV fluids.   [PS]  [id number] Ultrasounds unremarkable.  Patient's not feeling any better, still does not feel like she could try to take anything by mouth.  With her multiple metabolic abnormalities and dehydration, I will discuss with gynecology about observation and further supportive care.  She previously delivered with [facility] and is scheduled for her first prenatal care appointment with them this month and prefers to stick with [facility] if possible.   [PS]    Clinical Course User Index [PS] Sharman Cheek, MD     ____________________________________________   FINAL CLINICAL IMPRESSION(S) / ED DIAGNOSES    Final diagnoses:  Second  trimester pregnancy  Hyperemesis gravidarum with dehydration  Hypokalemia     ED Discharge Orders    None      Portions of this note were generated with dragon dictation software. Dictation errors may occur despite best attempts at proofreading.   Sharman Cheek, MD 08/06/19 [phone]

## 2019-08-06 NOTE — ED Notes (Signed)
Patient transported to Ultrasound 

## 2019-08-06 NOTE — ED Notes (Signed)
ED TO INPATIENT HANDOFF REPORT  ED Nurse Name and Phone #: Arlyss Represslyssa, [phone]  S Name/Age/Gender Candice Moore 28 y.o. female Room/Bed: ED13A/ED13A  Code Status   Code Status: Full Code  Home/SNF/Other Home Patient oriented to: self, place, time and situation Is this baseline? Yes   Triage Complete: Triage complete  Chief Complaint Vomiting  Triage Note Pt arrives to ED via POV from home with c/o vomiting x"3 months". Pt reports being r/x'd medications for nausea, but has been ineffective. Pt states 4 episodes of emesis over the last 24 hrs. No c/o CP, no diarrhea or fever. Pt reports being [redacted] weeks pregnant; no pregnancy related c/o's. Pt is A&O, in NAD; RR even, regular, and unlabored.   Allergies No Known Allergies  Level of Care/Admitting Diagnosis ED Disposition    ED Disposition Condition Comment   Admit  Hospital Area: [facility] [[id number]]  Level of Care: Antepartum [20]  Covid Evaluation: Confirmed COVID Negative  Date Laboratory Confirmed COVID Negative: 08/06/2019  Diagnosis: Hyperemesis affecting pregnancy, antepartum [[id number]][[id number]]  Admitting Physician: Nadara MustardHARRIS, ROBERT P [[id number]]  Attending Physician: Nadara MustardHARRIS, ROBERT P [[id number]]  PT Class (Do Not Modify): Observation [104]  PT Acc Code (Do Not Modify): Observation [10022]       B Medical/Surgery History Past Medical History:  Diagnosis Date  . Asthma   . GERD (gastroesophageal reflux disease)    Past Surgical History:  Procedure Laterality Date  . CHOLECYSTECTOMY       A IV Location/Drains/Wounds Patient Lines/Drains/Airways Status   Active Line/Drains/Airways    Name:   Placement date:   Placement time:   Site:   Days:   Peripheral IV 08/06/19 Right Antecubital   08/06/19    0255    Antecubital   less than 1          Intake/Output Last 24 hours  Intake/Output Summary (Last 24 hours) at 08/06/2019 0732 Last data filed at 08/06/2019 0540 Gross per 24 hour  Intake 1100 ml   Output -  Net 1100 ml    Labs/Imaging Results for orders placed or performed during the hospital encounter of 08/06/19 (from the past 48 hour(s))  Lipase, blood     Status: Abnormal   Collection Time: 08/06/19  2:51 AM  Result Value Ref Range   Lipase 106 (H) 11 - 51 U/L    Comment: Performed at [facility], [address] [place], [place] [id number]  Comprehensive metabolic panel     Status: Abnormal   Collection Time: 08/06/19  2:51 AM  Result Value Ref Range   Sodium 133 (L) 135 - 145 mmol/L   Potassium 2.6 (LL) 3.5 - 5.1 mmol/L    Comment: CRITICAL RESULT CALLED TO, READ BACK BY AND VERIFIED WITH [facility] RN AT 0330 ON 08/06/2019 SNG    Chloride 105 98 - 111 mmol/L   CO2 13 (L) 22 - 32 mmol/L   Glucose, Bld 106 (H) 70 - 99 mg/dL   BUN 7 6 - 20 mg/dL   Creatinine, Ser 2.950.49 0.44 - 1.00 mg/dL   Calcium 9.4 8.9 - 28.410.3 mg/dL   Total Protein 8.6 (H) 6.5 - 8.1 g/dL   Albumin 3.9 3.5 - 5.0 g/dL   AST 65 (H) 15 - 41 U/L   ALT 145 (H) 0 - 44 U/L   Alkaline Phosphatase 145 (H) 38 - 126 U/L   Total Bilirubin 2.1 (H) 0.3 - 1.2 mg/dL   GFR calc non Af Amer >60 >60 mL/min  GFR calc Af Amer >60 >60 mL/min   Anion gap 15 5 - 15    Comment: Performed at [facility], [address] [place], [place] [address]  CBC     Status: Abnormal   Collection Time: 08/06/19  2:51 AM  Result Value Ref Range   WBC 9.0 4.0 - 10.5 K/uL   RBC 5.10 3.87 - 5.11 MIL/uL   Hemoglobin 13.6 12.0 - 15.0 g/dL   HCT 19.1 47.8 - 29.5 %   MCV 77.6 (L) 80.0 - 100.0 fL   MCH 26.7 26.0 - 34.0 pg   MCHC 34.3 30.0 - 36.0 g/dL   RDW 62.1 30.8 - 65.7 %   Platelets 377 150 - 400 K/uL   nRBC 0.0 0.0 - 0.2 %    Comment: Performed at [facility], [address] [place], [place] [address]  Urinalysis, Complete w Microscopic     Status: Abnormal   Collection Time: 08/06/19  2:51 AM  Result Value Ref Range   Color, Urine AMBER (A) YELLOW    Comment: BIOCHEMICALS MAY BE AFFECTED  BY COLOR   APPearance HAZY (A) CLEAR   Specific Gravity, Urine 1.026 1.005 - 1.030   pH 6.0 5.0 - 8.0   Glucose, UA NEGATIVE NEGATIVE mg/dL   Hgb urine dipstick NEGATIVE NEGATIVE   Bilirubin Urine SMALL (A) NEGATIVE   Ketones, ur 80 (A) NEGATIVE mg/dL   Protein, ur 295 (A) NEGATIVE mg/dL   Nitrite NEGATIVE NEGATIVE   Leukocytes,Ua NEGATIVE NEGATIVE   RBC / HPF 0-5 0 - 5 RBC/hpf   WBC, UA 0-5 0 - 5 WBC/hpf   Bacteria, UA RARE (A) NONE SEEN   Squamous Epithelial / LPF 6-10 0 - 5   Mucus PRESENT    Hyaline Casts, UA PRESENT     Comment: Performed at [facility], [address] [place], [place] [address]  hCG, quantitative, pregnancy     Status: Abnormal   Collection Time: 08/06/19  2:51 AM  Result Value Ref Range   hCG, Beta Chain, Quant, S 34,483 (H) <5 mIU/mL    Comment:          GEST. AGE      CONC.  (mIU/mL)   <=1 WEEK        5 - 50     2 WEEKS       50 - 500     3 WEEKS       100 - 10,000     4 WEEKS     1,000 - 30,000     5 WEEKS     3,500 - 115,000   6-8 WEEKS     12,000 - 270,000    12 WEEKS     15,000 - 220,000        FEMALE AND NON-PREGNANT FEMALE:     LESS THAN 5 mIU/mL Performed at [facility], [address] [place], [place] [address]   Wet prep, genital     Status: Abnormal   Collection Time: 08/06/19  2:51 AM  Result Value Ref Range   Yeast Wet Prep HPF POC NONE SEEN NONE SEEN   Trich, Wet Prep NONE SEEN NONE SEEN   Clue Cells Wet Prep HPF POC NONE SEEN NONE SEEN   WBC, Wet Prep HPF POC FEW (A) NONE SEEN   Sperm NONE SEEN     Comment: Performed at [facility], [address], [place], [place] [address]  Pregnancy, urine  Status: Abnormal   Collection Time: 08/06/19  2:51 AM  Result Value Ref Range   Preg Test, Ur POSITIVE (A) NEGATIVE    Comment: Performed at [facility], [address] [place] [place], [place] [address]   US Ob Comp Less 14 Wks  Result Date: 08/06/2019 CLINICAL DATA:  Generalized abdominal  pain. EXAM: OBSTETRIC <14 WK ULTRASOUND TECHNIQUE: Transabdominal ultrasound was performed for evaluation of the gestation as well as the maternal uterus and adnexal regions. COMPARISON:  Ultrasound 06/23/2019. FINDINGS: Intrauterine gestational sac: Single Yolk sac:  Not visualized Embryo:  Visualized Cardiac Activity: Visualized Heart Rate: 47 bpm CRL: 78.9 mm   13 w 6 d                  [place] EDC: 02/05/2020 Subchorionic hemorrhage:  None visualized Maternal uterus/adnexae: Braxton Hicks contraction noted. No other abnormality identified. IMPRESSION: Single viable intrauterine pregnancy at 13 weeks 6 days. Braxton Hicks contraction noted. No subchorionic hemorrhage noted on today's exam as noted on prior study of 06/23/2019. Electronically Signed   By: Maisie Fus  Register   On: 08/06/2019 05:56   US Abdomen Limited Ruq  Result Date: 08/06/2019 CLINICAL DATA:  Generalized abdominal pain. EXAM: ULTRASOUND ABDOMEN LIMITED RIGHT UPPER QUADRANT COMPARISON:  Ultrasound 07/29/2019.  CT 08/25/2014. FINDINGS: Gallbladder: Cholecystectomy. Common bile duct: Diameter: 3.8 mm Liver: A 2.5 x 2.5 x 3.7 cm hyperechoic well-circumscribed mass is noted in the left hepatic lobe. Similar findings noted on prior exam. This suggests that this lesion is a hemangioma. Again hepatic adenoma and focal nodular hyperplasia could present in this fashion. Given stability cavernous hemangioma is again favored. Normal hepatic parenchymal echogenicity. Portal vein is patent on color Doppler imaging with normal direction of blood flow towards the liver. Other: None. IMPRESSION: 1. A 2.5 x 2.5 x 3.7 cm hyperechoic well-circumscribed masses in the left hepatic lobe. Similar findings noted on prior exam. 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 As noted on prior study hepatic adenoma and focal nodular hyperplasia could present in this fashion. Again given stability cavernous hemangiomas favored. Given the patient's pregnancy and as  noted on prior report noncontrast MRI of the abdomen should be considered for further evaluation. 2.  Prior cholecystectomy.  No biliary distention. Electronically Signed   By: Maisie Fus  Register   On: 08/06/2019 05:49    Pending Labs Unresulted Labs (From admission, onward)    Start     Ordered   08/07/19 0500  Comprehensive metabolic panel  Tomorrow morning,   STAT     08/06/19 0717   08/07/19 0500  Glucose, fasting - daily while on taper  (methylPREDNisolone (MEDROL) taper)  Daily,   STAT    Comments: Notify Physician if fasting blood sugar > 95.    08/06/19 0717   08/06/19 0507  SARS CORONAVIRUS 2 (TAT 6-24 HRS) Nasopharyngeal Nasopharyngeal Swab  (Asymptomatic/Tier 2 Patients Labs)  Once,   STAT    Question Answer Comment  Is this test for diagnosis or screening Screening   Symptomatic for COVID-19 as defined by CDC No   Hospitalized for COVID-19 No   Admitted to ICU for COVID-19 No   Previously tested for COVID-19 Yes   Resident in a congregate (group) care setting No   Employed in healthcare setting No   Pregnant Yes      08/06/19 0506   08/06/19 0237  GC/Chlamydia Probe Amp ([facility] send-out)   Once,   STAT     08/06/19 0962  Vitals/Pain Today's Vitals   08/06/19 0539 08/06/19 0600 08/06/19 0620 08/06/19 0700  BP:  (!) 88/60 92/71 100/68  Pulse: 76 70 66 75  Resp:      Temp:      TempSrc:      SpO2: 100% 100% 100% 100%  Weight:      Height:      PainSc:        Isolation Precautions No active isolations  Medications Medications  lactated ringers 1,000 mL with potassium chloride 20 mEq infusion (has no administration in time range)    And  lactated ringers 1,000 mL with thiamine 497 mg, folic acid 0.6 mg, multivitamins adult 10 mL, potassium chloride 20 mEq infusion (has no administration in time range)  metoCLOPramide (REGLAN) tablet 10 mg (has no administration in time range)    Or  metoCLOPramide (REGLAN) injection 10 mg (has no administration in  time range)  ondansetron (ZOFRAN) injection 4 mg (has no administration in time range)  hydrOXYzine (VISTARIL) injection 50 mg (has no administration in time range)  famotidine (PEPCID) tablet 20 mg (has no administration in time range)    Or  famotidine (PEPCID) IVPB 20 mg premix (has no administration in time range)  methylPREDNISolone sodium succinate (SOLU-MEDROL) 125 mg/2 mL injection 48 mg (has no administration in time range)  methylPREDNISolone (MEDROL) tablet 16 mg (has no administration in time range)    Followed by  methylPREDNISolone (MEDROL) tablet 8 mg (has no administration in time range)    Followed by  methylPREDNISolone (MEDROL) tablet 4 mg (has no administration in time range)  methylPREDNISolone (MEDROL) tablet 16 mg (has no administration in time range)    Followed by  methylPREDNISolone (MEDROL) tablet 8 mg (has no administration in time range)    Followed by  methylPREDNISolone (MEDROL) tablet 4 mg (has no administration in time range)  methylPREDNISolone (MEDROL) tablet 16 mg (has no administration in time range)    Followed by  methylPREDNISolone (MEDROL) tablet 8 mg (has no administration in time range)    Followed by  methylPREDNISolone (MEDROL) tablet 4 mg (has no administration in time range)  pyridOXINE (B-6) injection 100 mg (has no administration in time range)  acetaminophen (TYLENOL) tablet 650 mg (has no administration in time range)  zolpidem (AMBIEN) tablet 5 mg (has no administration in time range)  prenatal multivitamin tablet 1 tablet (has no administration in time range)  dextrose 5 %-0.45 % sodium chloride infusion ( Intravenous Stopped 08/06/19 0540)  metoCLOPramide (REGLAN) injection 10 mg (10 mg Intravenous Given 08/06/19 0255)  diphenhydrAMINE (BENADRYL) injection 25 mg (25 mg Intravenous Given 08/06/19 0255)  potassium chloride SA (KLOR-CON) CR tablet 40 mEq (40 mEq Oral Given 08/06/19 0348)  cefTRIAXone (ROCEPHIN) 1 g in sodium chloride  0.9 % 100 mL IVPB (0 g Intravenous Stopped 08/06/19 0540)  azithromycin (ZITHROMAX) tablet 1,000 mg (1,000 mg Oral Given 08/06/19 0408)  sodium chloride 0.9 % bolus 1,000 mL (1,000 mLs Intravenous New Bag/Given 08/06/19 0540)    Mobility walks Low fall risk   Focused Assessments N/A   R Recommendations: See Admitting Provider Note  Report given to:   Additional Notes:

## 2019-08-06 NOTE — H&P (Signed)
Obstetrics Admission History & Physical   Emesis   HPI:  28 y.o. [id number] @ [redacted]w[redacted]d based on LMP and confirmed by 7 week [place]. Admitted on 08/06/2019:   Presents for continued bout of nausea and vomiting for last several weeks. Has tried "all kinds of medicines" thru her [facility] providers at [facility] as well as multiple ER visits, without success.  Weak, dizzy.  Prior 2 pregnancies she did mot have this problem. Has appt w [facility] for transfer and continued care, prior delivery w [facility] as well.  No pain, vag bleeding, other associated sx's.  NSVD x2, no other prior pregnancy complications.  ROS: A review of systems was performed and negative, except as stated in the above HPI. PMHx:  Past Medical History:  Diagnosis Date  . Asthma   . GERD (gastroesophageal reflux disease)    PSHx:  Past Surgical History:  Procedure Laterality Date  . CHOLECYSTECTOMY     Medications:  Current Outpatient Medications:  .  metoCLOPramide (REGLAN) 10 MG tablet, Take 1 tablet (10 mg total) by mouth every 8 (eight) hours as needed for nausea., Disp: 90 tablet, Rfl: 0 .  ondansetron (ZOFRAN-ODT) 4 MG disintegrating tablet, Take 4 mg by mouth every 8 (eight) hours as needed., Disp: , Rfl:  .  Prenatal Vit-Fe Fumarate-FA (MULTIVITAMIN-PRENATAL) 27-0.8 MG TABS tablet, Take 1 tablet by mouth daily at 12 noon., Disp: , Rfl:  .  promethazine (PHENERGAN) 25 MG tablet, Take 12.5-25 mg by mouth every 6 (six) hours as needed., Disp: , Rfl:   Allergies: has No Known Allergies. OBHx:  OB History  Gravida Para Term Preterm AB Living  4 2 2  0 1 1  SAB TAB Ectopic Multiple Live Births  0 1 0 0 1    # Outcome Date GA Lbr Len/2nd Weight Sex Delivery Anes PTL Lv  4 Current           3 Term 11/02/16 [redacted]w[redacted]d  4120 g M Vag-Spont None    2 TAB           1 Term            [redacted]w[redacted]d except as detailed in HPI.DQQ:IWLNLGXQ/JJHERDEYCXKG  No family history of birth defects. Soc Hx: Alcohol: none and Recreational drug use:  none  Objective:   Vitals:   08/06/19 0600 08/06/19 0620  BP: (!) 88/60 92/71  Pulse: 70 66  Resp:    Temp:    SpO2: 100% 100%   Constitutional: Well nourished, well developed female in no acute distress.  HEENT: normal Skin: Warm and dry.  Cardiovascular:Regular rate and rhythm.   Extremity: trace to 1+ bilateral pedal edema Respiratory: Clear to auscultation bilateral. Normal respiratory effort Abdomen: gravid, ND, FHT present, without guarding, without rebound tenderness on exam Back: no CVAT Neuro: DTRs 2+, Cranial nerves grossly intact Psych: Alert and Oriented x3. No memory deficits. Normal mood and affect.  MS: normal gait, normal bilateral lower extremity ROM/strength/stability.  Pelvic exam: is deferred  Results for orders placed or performed during the hospital encounter of 08/06/19  Wet prep, genital  Result Value Ref Range   Yeast Wet Prep HPF POC NONE SEEN NONE SEEN   Trich, Wet Prep NONE SEEN NONE SEEN   Clue Cells Wet Prep HPF POC NONE SEEN NONE SEEN   WBC, Wet Prep HPF POC FEW (A) NONE SEEN   Sperm NONE SEEN   Lipase, blood  Result Value Ref Range   Lipase 106 (H) 11 - 51 U/L  Comprehensive metabolic panel  Result Value Ref Range   Sodium 133 (L) 135 - 145 mmol/L   Potassium 2.6 (LL) 3.5 - 5.1 mmol/L   Chloride 105 98 - 111 mmol/L   CO2 13 (L) 22 - 32 mmol/L   Glucose, Bld 106 (H) 70 - 99 mg/dL   BUN 7 6 - 20 mg/dL   Creatinine, Ser 0.49 0.44 - 1.00 mg/dL   Calcium 9.4 8.9 - 10.3 mg/dL   Total Protein 8.6 (H) 6.5 - 8.1 g/dL   Albumin 3.9 3.5 - 5.0 g/dL   AST 65 (H) 15 - 41 U/L   ALT 145 (H) 0 - 44 U/L   Alkaline Phosphatase 145 (H) 38 - 126 U/L   Total Bilirubin 2.1 (H) 0.3 - 1.2 mg/dL   GFR calc non Af Amer >60 >60 mL/min   GFR calc Af Amer >60 >60 mL/min   Anion gap 15 5 - 15  CBC  Result Value Ref Range   WBC 9.0 4.0 - 10.5 K/uL   RBC 5.10 3.87 - 5.11 MIL/uL   Hemoglobin 13.6 12.0 - 15.0 g/dL   HCT 39.6 36.0 - 46.0 %   MCV 77.6 (L) 80.0  - 100.0 fL   MCH 26.7 26.0 - 34.0 pg   MCHC 34.3 30.0 - 36.0 g/dL   RDW 13.7 11.5 - 15.5 %   Platelets 377 150 - 400 K/uL   nRBC 0.0 0.0 - 0.2 %  Urinalysis, Complete w Microscopic  Result Value Ref Range   Color, Urine AMBER (A) YELLOW   APPearance HAZY (A) CLEAR   Specific Gravity, Urine 1.026 1.005 - 1.030   pH 6.0 5.0 - 8.0   Glucose, UA NEGATIVE NEGATIVE mg/dL   Hgb urine dipstick NEGATIVE NEGATIVE   Bilirubin Urine SMALL (A) NEGATIVE   Ketones, ur 80 (A) NEGATIVE mg/dL   Protein, ur 100 (A) NEGATIVE mg/dL   Nitrite NEGATIVE NEGATIVE   Leukocytes,Ua NEGATIVE NEGATIVE   RBC / HPF 0-5 0 - 5 RBC/hpf   WBC, UA 0-5 0 - 5 WBC/hpf   Bacteria, UA RARE (A) NONE SEEN   Squamous Epithelial / LPF 6-10 0 - 5   Mucus PRESENT    Hyaline Casts, UA PRESENT   hCG, quantitative, pregnancy  Result Value Ref Range   hCG, Beta Chain, Quant, S 34,483 (H) <5 mIU/mL  Pregnancy, urine  Result Value Ref Range   Preg Test, Ur POSITIVE (A) NEGATIVE    Assessment & Plan:   28 y.o. [id number] @ [redacted]w[redacted]d, Admitted on 08/06/2019:HYPEREMESIS GRAVIDARUM Pt will be admitted for inpatient management until better able to hydrate and nourish Steroid taper - 48 mg IV today followed by 16mg  TID days 2-4, followed by 8mg  TID days 5-12, then 4 mg TID days 13-14, either IV or po based on when can transition Reglan, B6 also PRN meds ordered Will leave NPO for now, but allow diet once see resolving sx's IVF, replenish K+  Barnett Applebaum, MD, [facility], [facility] 08/06/2019  7:27 AM

## 2019-08-06 NOTE — ED Triage Notes (Addendum)
Pt arrives to ED via POV from home with c/o vomiting x"3 months". Pt reports being r/x'd medications for nausea, but has been ineffective. Pt states 4 episodes of emesis over the last 24 hrs. No c/o CP, no diarrhea or fever. Pt reports being [redacted] weeks pregnant; no pregnancy related c/o's. Pt is A&O, in NAD; RR even, regular, and unlabored.

## 2019-08-07 DIAGNOSIS — O26891 Other specified pregnancy related conditions, first trimester: Secondary | ICD-10-CM | POA: Diagnosis present

## 2019-08-07 DIAGNOSIS — Z23 Encounter for immunization: Secondary | ICD-10-CM | POA: Diagnosis not present

## 2019-08-07 DIAGNOSIS — R748 Abnormal levels of other serum enzymes: Secondary | ICD-10-CM | POA: Diagnosis not present

## 2019-08-07 DIAGNOSIS — Z20828 Contact with and (suspected) exposure to other viral communicable diseases: Secondary | ICD-10-CM | POA: Diagnosis present

## 2019-08-07 DIAGNOSIS — Z9049 Acquired absence of other specified parts of digestive tract: Secondary | ICD-10-CM | POA: Diagnosis not present

## 2019-08-07 DIAGNOSIS — E876 Hypokalemia: Secondary | ICD-10-CM

## 2019-08-07 DIAGNOSIS — O210 Mild hyperemesis gravidarum: Secondary | ICD-10-CM | POA: Diagnosis present

## 2019-08-07 DIAGNOSIS — K5900 Constipation, unspecified: Secondary | ICD-10-CM | POA: Diagnosis present

## 2019-08-07 DIAGNOSIS — O99281 Endocrine, nutritional and metabolic diseases complicating pregnancy, first trimester: Secondary | ICD-10-CM

## 2019-08-07 DIAGNOSIS — O211 Hyperemesis gravidarum with metabolic disturbance: Secondary | ICD-10-CM | POA: Diagnosis present

## 2019-08-07 DIAGNOSIS — K5909 Other constipation: Secondary | ICD-10-CM | POA: Diagnosis not present

## 2019-08-07 DIAGNOSIS — Z3A13 13 weeks gestation of pregnancy: Secondary | ICD-10-CM | POA: Diagnosis not present

## 2019-08-07 DIAGNOSIS — O99211 Obesity complicating pregnancy, first trimester: Secondary | ICD-10-CM | POA: Diagnosis present

## 2019-08-07 LAB — COMPREHENSIVE METABOLIC PANEL
ALT: 121 U/L — ABNORMAL HIGH (ref 0–44)
AST: 61 U/L — ABNORMAL HIGH (ref 15–41)
Albumin: 3 g/dL — ABNORMAL LOW (ref 3.5–5.0)
Alkaline Phosphatase: 109 U/L (ref 38–126)
Anion gap: 12 (ref 5–15)
BUN: <5 mg/dL — ABNORMAL LOW (ref 6–20)
CO2: 18 mmol/L — ABNORMAL LOW (ref 22–32)
Calcium: 8.8 mg/dL — ABNORMAL LOW (ref 8.9–10.3)
Chloride: 107 mmol/L (ref 98–111)
Creatinine, Ser: 0.4 mg/dL — ABNORMAL LOW (ref 0.44–1.00)
GFR calc Af Amer: >60 mL/min (ref >60)
GFR calc non Af Amer: >60 mL/min (ref >60)
Glucose, Bld: 78 mg/dL (ref 70–99)
Potassium: 2.7 mmol/L — CL (ref 3.5–5.1)
Sodium: 137 mmol/L (ref 135–145)
Total Bilirubin: 1.5 mg/dL — ABNORMAL HIGH (ref 0.3–1.2)
Total Protein: 6.9 g/dL (ref 6.5–8.1)

## 2019-08-07 LAB — GLUCOSE, CAPILLARY: Glucose-Capillary: 79 mg/dL (ref 70–99)

## 2019-08-07 LAB — MAGNESIUM: Magnesium: 1.6 mg/dL — ABNORMAL LOW (ref 1.7–2.4)

## 2019-08-07 LAB — POTASSIUM: Potassium: 2.9 mmol/L — ABNORMAL LOW (ref 3.5–5.1)

## 2019-08-07 MED ORDER — POTASSIUM CHLORIDE 10 MEQ/100ML IV SOLN
10.0000 meq | INTRAVENOUS | Status: AC
  Administered 2019-08-07 (×2): 10 meq via INTRAVENOUS
  Filled 2019-08-07 (×3): qty 100

## 2019-08-07 MED ORDER — POTASSIUM CHLORIDE CRYS ER 20 MEQ PO TBCR
30.0000 meq | EXTENDED_RELEASE_TABLET | Freq: Two times a day (BID) | ORAL | Status: AC
  Administered 2019-08-07 – 2019-08-08 (×3): 30 meq via ORAL
  Filled 2019-08-07 (×3): qty 2

## 2019-08-07 MED ORDER — POTASSIUM CHLORIDE 20 MEQ PO PACK
20.0000 meq | PACK | Freq: Two times a day (BID) | ORAL | Status: DC
  Filled 2019-08-07: qty 1

## 2019-08-07 MED ORDER — POLYETHYLENE GLYCOL 3350 17 G PO PACK
17.0000 g | PACK | Freq: Every day | ORAL | Status: DC
  Administered 2019-08-07: 17 g via ORAL
  Filled 2019-08-07 (×4): qty 1

## 2019-08-07 MED ORDER — POTASSIUM CHLORIDE CRYS ER 10 MEQ PO TBCR
10.0000 meq | EXTENDED_RELEASE_TABLET | Freq: Once | ORAL | Status: AC
  Administered 2019-08-07: 10 meq via ORAL
  Filled 2019-08-07: qty 1

## 2019-08-07 MED ORDER — MAGNESIUM SULFATE 2 GM/50ML IV SOLN
2.0000 g | Freq: Once | INTRAVENOUS | Status: AC
  Administered 2019-08-07: 2 g via INTRAVENOUS
  Filled 2019-08-07: qty 50

## 2019-08-07 NOTE — Progress Notes (Signed)
Unable to get FHT x3 different heart tones. Rod Can CNM notified and to come try to get [place].

## 2019-08-07 NOTE — Progress Notes (Signed)
S: The patient reports she is feeling a little better today. She is not able to keep solids down and is able to keep fluids down. She has been hypersensitive to smells since recovering from Covid 2 months ago- about the same time that her severe N/V started. In general she has avoided a high fat diet since having cholecystectomy. She reports "not eating for the past 2 months" or having a bowel movement in the same amount of time. She was told that zofran was constipating and thought it was normal that she had not had a bowel movement in 2 months. She admits passing gas. She is wondering how long she will need to be in the hospital. We discussed plan of care and importance of her ability to take PO food, fluids and medication.   Her problem list includes, obesity with BMI greater than 40, history of asthma, GERD, Cholecystectomy, +Covid infection and E Coli UTI (not treated/70K) 3 months ago  O: Vital Signs: BP (!) 108/57 (BP Location: Left Arm) Comment: nurse Skyler Stone notified  Pulse 92   Temp 98.2 F (36.8 C) (Oral)   Resp 18   Ht 5\' 2"  (1.575 m)   Wt 101.1 kg   LMP 05/04/2019   SpO2 97%   BMI 40.77 kg/m  Constitutional: Well nourished, well developed female in no acute distress.  HEENT: normal Skin: Warm and dry.  Cardiovascular: Regular rate and rhythm.   Extremity: no edema  Respiratory: Clear to auscultation bilateral. Normal respiratory effort Abdomen: FHT present, 134 by doppler Back: no CVAT Neuro: DTRs 2+, Cranial nerves grossly intact Psych: Alert and Oriented x3. No memory deficits. Normal mood and affect.   Results for Candice Moore, Candice Moore (MRN [id number]) as of 08/07/2019 11:32  Ref. Range 08/07/2019 04:46 08/07/2019 05:01 08/07/2019 05:02  Glucose-Capillary Latest Ref Range: 70 - 99 mg/dL 79    COMPREHENSIVE METABOLIC PANEL Unknown   Rpt (A)  Sodium Latest Ref Range: 135 - 145 mmol/L   137  Potassium Latest Ref Range: 3.5 - 5.1 mmol/L   2.7 (LL)  Chloride Latest Ref  Range: 98 - 111 mmol/L   107  CO2 Latest Ref Range: 22 - 32 mmol/L   18 (L)  Glucose Latest Ref Range: 70 - 99 mg/dL   78  BUN Latest Ref Range: 6 - 20 mg/dL   <5 (L)  Creatinine Latest Ref Range: 0.44 - 1.00 mg/dL   0.40 (L)  Calcium Latest Ref Range: 8.9 - 10.3 mg/dL   8.8 (L)  Anion gap Latest Ref Range: 5 - 15    12  Magnesium Latest Ref Range: 1.7 - 2.4 mg/dL  1.6 (L)   Alkaline Phosphatase Latest Ref Range: 38 - 126 U/L   109  Albumin Latest Ref Range: 3.5 - 5.0 g/dL   3.0 (L)  AST Latest Ref Range: 15 - 41 U/L   61 (H)  ALT Latest Ref Range: 0 - 44 U/L   121 (H)  Total Protein Latest Ref Range: 6.5 - 8.1 g/dL   6.9  Total Bilirubin Latest Ref Range: 0.3 - 1.2 mg/dL   1.5 (H)  GFR, Est Non African American Latest Ref Range: >60 mL/min   >60  GFR, Est African American Latest Ref Range: >60 mL/min   >60   A: 28 yo G4 P2012 female, IUP at 13 weeks, hyperemesis, hypokalemia, elevated liver enzymes  P: Clear liquid diet with advance as tolerated IV fluids including daily banana bag, KCl repletion, IV magnesium sulfate (  due to no initial improvement in potassium levels and low mag level) Steroid taper per Dr Tiburcio Pea' order Reglan, zofran, vistaril, pepcid, B6 Miralax for constipation Potassium lab recheck later today   Candice Moore, CNM [facility] 08/07/2019, 11:49 AM

## 2019-08-07 NOTE — Progress Notes (Signed)
Update to earlier progress note:  1. Consult with Dr Gilman Schmidt regarding plan of care for patient:  Per her recommendations: Hepatitis panel RUQ ultrasound (this has already been done- please see report below) GI consult for evaluation of no bowel movement for 2 months  Orders placed   CLINICAL DATA:  Generalized abdominal pain.  EXAM: ULTRASOUND ABDOMEN LIMITED RIGHT UPPER QUADRANT  COMPARISON:  Ultrasound 07/29/2019.  CT 08/25/2014.  FINDINGS: Gallbladder:  Cholecystectomy.  Common bile duct:  Diameter: 3.8 mm  Liver:  A 2.5 x 2.5 x 3.7 cm hyperechoic well-circumscribed mass is noted in the left hepatic lobe. Similar findings noted on prior exam. This suggests that this lesion is a hemangioma. Again hepatic adenoma and focal nodular hyperplasia could present in this fashion. Given stability cavernous hemangioma is again favored. Normal hepatic parenchymal echogenicity. Portal vein is patent on color Doppler imaging with normal direction of blood flow towards the liver.  Other: None.  IMPRESSION: 1. A 2.5 x 2.5 x 3.7 cm hyperechoic well-circumscribed masses in the left hepatic lobe. Similar findings noted on prior exam. 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 As noted on prior study hepatic adenoma and focal nodular hyperplasia could present in this fashion. Again given stability cavernous hemangiomas favored. Given the patient's pregnancy and as noted on prior report noncontrast MRI of the abdomen should be considered for further evaluation.  2.  Prior cholecystectomy.  No biliary distention.   Electronically Signed   By: Marcello Moores  Register   On: 08/06/2019 05:49   2. Went to patient room to obtain fetal heart tones per RN request Fetal heart tones heard by doppler: 145 bpm  3. Per infectious disease control patient must be on contact precautions on any hospital admission for history of E Coli in urine for a 1 year period from  time of infection.  70K E Coli bacteriuria was diagnosed on 04/28/2019.  Per [facility] Eleonore Chiquito note on 05/01/2019 decision was made not to treat due to colony count below 100K and asymptomatic/not pregnant at the time, and to prescribe Macrobid 100mg  BID x5 days if patient called back with symptoms.  Of note: patient was prescribed and did take per her report, Amoxicillin the week before urine culture due to Strep throat diagnosis. Also of note: urine culture done 2 weeks ago was insignificant with less than 10K colony count- no specific bacteria mentioned.   Christean Leaf, CNM [facility] 08/07/2019, 1:41 PM

## 2019-08-08 DIAGNOSIS — K5909 Other constipation: Secondary | ICD-10-CM

## 2019-08-08 DIAGNOSIS — R748 Abnormal levels of other serum enzymes: Secondary | ICD-10-CM | POA: Diagnosis present

## 2019-08-08 DIAGNOSIS — E876 Hypokalemia: Secondary | ICD-10-CM | POA: Diagnosis present

## 2019-08-08 DIAGNOSIS — O210 Mild hyperemesis gravidarum: Secondary | ICD-10-CM

## 2019-08-08 LAB — GLUCOSE, RANDOM: Glucose, Bld: 115 mg/dL — ABNORMAL HIGH (ref 70–99)

## 2019-08-08 LAB — ELECTROLYTE PANEL
Anion gap: 10 (ref 5–15)
CO2: 21 mmol/L — ABNORMAL LOW (ref 22–32)
Chloride: 106 mmol/L (ref 98–111)
Potassium: 3.4 mmol/L — ABNORMAL LOW (ref 3.5–5.1)
Sodium: 137 mmol/L (ref 135–145)

## 2019-08-08 LAB — GC/CHLAMYDIA PROBE AMP
Chlamydia trachomatis, NAA: NEGATIVE
Neisseria Gonorrhoeae by PCR: NEGATIVE

## 2019-08-08 LAB — HEPATITIS PANEL, ACUTE
HCV Ab: NONREACTIVE
Hep A IgM: NONREACTIVE
Hep B C IgM: NONREACTIVE
Hepatitis B Surface Ag: NONREACTIVE

## 2019-08-08 LAB — GLUCOSE, CAPILLARY: Glucose-Capillary: 110 mg/dL — ABNORMAL HIGH (ref 70–99)

## 2019-08-08 MED ORDER — INFLUENZA VAC SPLIT QUAD 0.5 ML IM SUSY
0.5000 mL | PREFILLED_SYRINGE | INTRAMUSCULAR | Status: AC
  Administered 2019-08-09: 0.5 mL via INTRAMUSCULAR
  Filled 2019-08-08: qty 0.5

## 2019-08-08 NOTE — Consult Note (Signed)
Vonda Antigua, MD [address] [place], [place], [place], [address] [address], [place], [place], [place], [address] Phone: [phone]  Fax: [phone]  Consultation  Referring Provider:     Dr. Gilman Schmidt Primary Care Physician:  [facility] Reason for Consultation:    N/V, constipation  Date of Admission:  08/06/2019 Date of Consultation:  08/08/2019         HPI:   Candice Moore is a 28 y.o. female [redacted] weeks pregnant, with GI consulted for nausea vomiting and constipation.  Reportedly, patient had not had a bowel movement in 2 months while at home and GI was consulted.  When I saw her this morning, patient has had a good sized bowel movement and she states her stomach feels better and soft and she has not had any further nausea and vomiting and has been able to tolerate oral diet without difficulty. no blood in stool.  Was not taking anything for constipation at home.  Past Medical History:  Diagnosis Date  . Asthma   . GERD (gastroesophageal reflux disease)     Past Surgical History:  Procedure Laterality Date  . CHOLECYSTECTOMY      Prior to Admission medications   Medication Sig Start Date End Date Taking? Authorizing Provider  famotidine (PEPCID) 20 MG tablet Take 20 mg by mouth 2 (two) times daily. 07/23/19   [provider]  metoCLOPramide (REGLAN) 10 MG tablet Take 1 tablet (10 mg total) by mouth every 8 (eight) hours as needed for nausea. 07/29/19 08/28/19  Vanessa [place], MD  ondansetron (ZOFRAN-ODT) 4 MG disintegrating tablet Take 4 mg by mouth every 8 (eight) hours as needed. 07/18/19   [provider]  promethazine (PHENERGAN) 25 MG tablet Take 12.5-25 mg by mouth every 6 (six) hours as needed. 07/23/19   [provider]    Family History  Problem Relation Age of Onset  . Breast cancer Maternal Grandmother   . Colon cancer Maternal Grandfather      Social History   Tobacco Use  . Smoking  status: Never Smoker  . Smokeless tobacco: Never Used  Substance Use Topics  . Alcohol use: No  . Drug use: No    Allergies as of 08/06/2019  . (No Known Allergies)    Review of Systems:    All systems reviewed and negative except where noted in HPI.   Physical Exam:  Vital signs in last 24 hours: Vitals:   08/07/19 2350 08/08/19 0418 08/08/19 0500 08/08/19 0801  BP: (!) 118/55 (!) 119/49  (!) 115/56  Pulse: 65 77  80  Resp:    18  Temp:  98.3 F (36.8 C)  98.2 F (36.8 C)  TempSrc:  Oral  Oral  SpO2: 99% 98%  100%  Weight:   100.5 kg   Height:       Last BM Date: 08/08/19 General:   Pleasant, cooperative in NAD Head:  Normocephalic and atraumatic. Eyes:   No icterus.   Conjunctiva pink. PERRLA. Ears:  Normal auditory acuity. Neck:  Supple; no masses or thyroidomegaly Lungs: Respirations even and unlabored. Lungs clear to auscultation bilaterally.   No wheezes, crackles, or rhonchi.  Abdomen:  Soft, nondistended, nontender. Normal bowel sounds. No appreciable masses or hepatomegaly.  No rebound or guarding.  Neurologic:  Alert and oriented x3;  grossly normal neurologically. Skin:  Intact without significant lesions or rashes. Cervical Nodes:  No significant cervical adenopathy. Psych:  Alert and cooperative. Normal  affect.  LAB RESULTS: Recent Labs    08/06/19 0251  WBC 9.0  HGB 13.6  HCT 39.6  PLT 377   BMET Recent Labs    08/06/19 0251 08/07/19 0502 08/07/19 1629 08/08/19 0737  NA 133* 137  --  137  K 2.6* 2.7* 2.9* 3.4*  CL 105 107  --  106  CO2 13* 18*  --  21*  GLUCOSE 106* 78  --  115*  BUN 7 <5*  --   --   CREATININE 0.49 0.40*  --   --   CALCIUM 9.4 8.8*  --   --    LFT Recent Labs    08/07/19 0502  PROT 6.9  ALBUMIN 3.0*  AST 61*  ALT 121*  ALKPHOS 109  BILITOT 1.5*   PT/INR No results for input(s): LABPROT, INR in the last 72 hours.  STUDIES: No results found.    Impression / Plan:   Candice Moore is a 28 y.o. y/o  female who is [redacted] weeks pregnant and GI was consulted for nausea vomiting and constipation  Her symptoms have now resolved after she had a good sized bowel movement this morning  She was placed on MiraLAX by primary team with good results  She also had electrolyte abnormalities when she presented with potassium of 2.7  would recommend continuing to monitor electrolytes and replace as needed  Patient should eat a high-fiber diet at home and encouraged to take in at least 10 glasses of water a day  If constipation continues despite above measures at home, pregnancy safe medication such as Metamucil can be used if okay with her OB team  Patient also had elevated transaminases dating back to November 4.  This may have been due to her nausea and vomiting.  These are improving.  Continue to monitor to ensure these continue to improve.  Avoid hepatotoxic drugs  Thank you for involving me in the care of this patient.      LOS: 1 day   Pasty Spillers, MD  08/08/2019, 10:20 AM

## 2019-08-08 NOTE — Progress Notes (Signed)
Daily Antepartum Note  Admission Date: 08/06/2019 Current Date: 08/08/2019 10:00 AM  Ninel Abdella is a 28 y.o. G4P2011 @ [redacted]w[redacted]d by LMP consistent with a 7 week u/s, HD#3, admitted for hyperemesis gravidarum, hypokalemia, and elevated liver enzymes.  Pregnancy complicated by: Obesity with BMI >40 and asthma  Patient Active Problem List   Diagnosis Date Noted  . Hyperemesis complicating pregnancy, antepartum 08/07/2019  Hypokalemia  Overnight/24hr events:  No acute events  Subjective:  Tolerating PO clear liquids. No nausea in 24 hours. No emesis in 48 hours. She had a BM x 2 yesterday. Her potassium is nearly normal as of this morning.  She denies vaginal bleeding.   Objective:   Vitals:   08/08/19 0418 08/08/19 0801  BP: (!) 119/49 (!) 115/56  Pulse: 77 80  Resp:  18  Temp: 98.3 F (36.8 C) 98.2 F (36.8 C)  SpO2: 98% 100%   Temp:  [98.1 F (36.7 C)-98.6 F (37 C)] 98.2 F (36.8 C) (11/14 0801) Pulse Rate:  [64-80] 80 (11/14 0801) Resp:  [18-20] 18 (11/14 0801) BP: (102-119)/(49-62) 115/56 (11/14 0801) SpO2:  [98 %-100 %] 100 % (11/14 0801) Weight:  [100.5 kg] 100.5 kg (11/14 0500) Temp (24hrs), Avg:98.3 F (36.8 C), Min:98.1 F (36.7 C), Max:98.6 F (37 C)   Intake/Output Summary (Last 24 hours) at 08/08/2019 1000 Last data filed at 08/08/2019 0900 Gross per 24 hour  Intake 3713.87 ml  Output 4625 ml  Net -911.13 ml     Current Vital Signs 24h Vital Sign Ranges  T 98.2 F (36.8 C) Temp  Avg: 98.3 F (36.8 C)  Min: 98.1 F (36.7 C)  Max: 98.6 F (37 C)  BP (!) 115/56 BP  Min: 102/51  Max: 119/49  HR 80 Pulse  Avg: 71.8  Min: 64  Max: 80  RR 18 Resp  Avg: 18.5  Min: 18  Max: 20  SaO2 100 % Room Air SpO2  Avg: 99 %  Min: 98 %  Max: 100 %       24 Hour I/O Current Shift I/O  Time Ins Outs 11/13 0701 - 11/14 0700 In: 3527.5 [P.O.:837; I.V.:2446.7] Out: 4225 [Urine:4225] 11/14 0701 - 11/14 1900 In: 186.4 [I.V.:186.4] Out: 600 [Urine:600]    Patient Vitals for the past 24 hrs:  BP Temp Temp src Pulse Resp SpO2 Weight  08/08/19 0801 (!) 115/56 98.2 F (36.8 C) Oral 80 18 100 % -  08/08/19 0500 - - - - - - 100.5 kg  08/08/19 0418 (!) 119/49 98.3 F (36.8 C) Oral 77 - 98 % -  08/07/19 2350 (!) 118/55 - - 65 - 99 % -  08/07/19 2348 (!) 102/51 98.1 F (36.7 C) Oral 64 20 99 % -  08/07/19 1919 117/62 98.3 F (36.8 C) Oral 75 18 99 % -  08/07/19 1221 (!) 113/55 98.6 F (37 C) Oral 70 18 - -    Physical exam: General: Well nourished, well developed female in no acute distress. Abdomen: s/nt Cardiovascular: RRR Respiratory: CTAB Extremities: no clubbing, cyanosis or edema Skin: Warm and dry.   Medications: Current Facility-Administered Medications  Medication Dose Route Frequency Provider Last Rate Last Dose  . acetaminophen (TYLENOL) tablet 650 mg  650 mg Oral Q4H PRN Nadara Mustard, MD      . famotidine (PEPCID) tablet 20 mg  20 mg Oral Q12H Nadara Mustard, MD   20 mg at 08/08/19 8588   Or  . famotidine (PEPCID) IVPB 20 mg premix  20 mg Intravenous Q12H Nadara MustardHarris, Robert P, MD   Stopped at 08/07/19 0920  . hydrOXYzine (VISTARIL) injection 50 mg  50 mg Intramuscular Q6H PRN Nadara MustardHarris, Robert P, MD      . Melene Muller[START ON 08/09/2019] influenza vac split quadrivalent PF (FLUARIX) injection 0.5 mL  0.5 mL Intramuscular Tomorrow-1000 Schuman, Christanna R, MD      . lactated ringers 1,000 mL with potassium chloride 20 mEq infusion   Intravenous Continuous Nadara MustardHarris, Robert P, MD   Stopped at 08/08/19 0328  . methylPREDNISolone (MEDROL) tablet 16 mg  16 mg Oral Q breakfast Schuman, Christanna R, MD   16 mg at 08/08/19 0925   Followed by  . [START ON 08/11/2019] methylPREDNISolone (MEDROL) tablet 8 mg  8 mg Oral Q breakfast Schuman, Christanna R, MD       Followed by  . [START ON 08/18/2019] methylPREDNISolone (MEDROL) tablet 4 mg  4 mg Oral Q breakfast Schuman, Christanna R, MD      . methylPREDNISolone (MEDROL) tablet 16 mg  16 mg  Oral Q1400 Nadara MustardHarris, Robert P, MD       Followed by  . [START ON 08/09/2019] methylPREDNISolone (MEDROL) tablet 8 mg  8 mg Oral Q1400 Nadara MustardHarris, Robert P, MD       Followed by  . [START ON 08/12/2019] methylPREDNISolone (MEDROL) tablet 4 mg  4 mg Oral Q1400 Nadara MustardHarris, Robert P, MD      . methylPREDNISolone (MEDROL) tablet 16 mg  16 mg Oral QHS Nadara MustardHarris, Robert P, MD   16 mg at 08/07/19 2315   Followed by  . [START ON 08/10/2019] methylPREDNISolone (MEDROL) tablet 8 mg  8 mg Oral QHS Nadara MustardHarris, Robert P, MD       Followed by  . [START ON 08/13/2019] methylPREDNISolone (MEDROL) tablet 4 mg  4 mg Oral QHS Nadara MustardHarris, Robert P, MD      . metoCLOPramide (REGLAN) tablet 10 mg  10 mg Oral Q6H Nadara MustardHarris, Robert P, MD   10 mg at 08/08/19 [id number]   Or  . metoCLOPramide (REGLAN) injection 10 mg  10 mg Intravenous Q6H Nadara MustardHarris, Robert P, MD   10 mg at 08/07/19 0835  . ondansetron (ZOFRAN) injection 4 mg  4 mg Intravenous Q8H PRN Nadara MustardHarris, Robert P, MD   4 mg at 08/07/19 0112  . polyethylene glycol (MIRALAX / GLYCOLAX) packet 17 g  17 g Oral Daily Tresea MallGledhill, Jane, CNM   17 g at 08/07/19 1318  . potassium chloride SA (KLOR-CON) CR tablet 30 mEq  30 mEq Oral BID Jerene PitchSchuman, Christanna R, MD   30 mEq at 08/08/19 0926  . prenatal multivitamin tablet 1 tablet  1 tablet Oral Q1200 Nadara MustardHarris, Robert P, MD      . pyridOXINE (B-6) injection 100 mg  100 mg Intravenous Daily Nadara MustardHarris, Robert P, MD   100 mg at 08/08/19 [id number]  . zolpidem (AMBIEN) tablet 5 mg  5 mg Oral QHS PRN Nadara MustardHarris, Robert P, MD        Labs:  Recent Labs  Lab 08/01/19 1210 08/06/19 0251  WBC 8.7 9.0  HGB 12.7 13.6  HCT 37.0 39.6  PLT 356 377    Recent Labs  Lab 08/01/19 1210 08/06/19 0251 08/07/19 0502 08/07/19 1629 08/08/19 0737  NA 132* 133* 137  --  137  K 3.2* 2.6* 2.7* 2.9* 3.4*  CL 102 105 107  --  106  CO2 13* 13* 18*  --  21*  BUN <5* 7 <5*  --   --  CREATININE 0.53 0.49 0.40*  --   --   CALCIUM 9.1 9.4 8.8*  --   --   PROT 8.2* 8.6* 6.9  --   --    BILITOT 2.0* 2.1* 1.5*  --   --   ALKPHOS 118 145* 109  --   --   ALT 77* 145* 121*  --   --   AST 35 65* 61*  --   --   GLUCOSE 91 106* 78  --  115*    Assessment & Plan:  29 y.o. V4Q5956 female at [redacted]w[redacted]d by LMP/7 here with hyperemesis gravidarum, hypokalemia, elevated liver enzymes overall improving.    *Pregnancy: continue PNVs as tolerated * Hyperemesis: Continue current regimen: Reglan, Zofran, Vitamin B6, pepcid * Hypokalemia: one more dose of 30 meQ today. Then OK to stop repletion.  * elevated liver enzymes: GI consult.  Possibly related to degree of nausea/emesis.  Findings on u/s of uncertain significance.  Will rely on GI for pertinent treatment/observation recommendations. *PPx: Pepcid, SCDs, ambulation *FEN/GI: clear liquids, advance as tolerated. Consider Ensure.  Lower IVF rate to [place]: home tomorrow with follow up for hospitalization early/mid next week  Prentice Docker, MD, [place], [facility] 08/08/2019 10:06 AM

## 2019-08-09 DIAGNOSIS — K5900 Constipation, unspecified: Secondary | ICD-10-CM

## 2019-08-09 LAB — GLUCOSE, CAPILLARY: Glucose-Capillary: 117 mg/dL — ABNORMAL HIGH (ref 70–99)

## 2019-08-09 LAB — HEPATIC FUNCTION PANEL
ALT: 131 U/L — ABNORMAL HIGH (ref 0–44)
AST: 55 U/L — ABNORMAL HIGH (ref 15–41)
Albumin: 3 g/dL — ABNORMAL LOW (ref 3.5–5.0)
Alkaline Phosphatase: 91 U/L (ref 38–126)
Bilirubin, Direct: 0.2 mg/dL (ref 0.0–0.2)
Indirect Bilirubin: 0.7 mg/dL (ref 0.3–0.9)
Total Bilirubin: 0.9 mg/dL (ref 0.3–1.2)
Total Protein: 6.6 g/dL (ref 6.5–8.1)

## 2019-08-09 LAB — POTASSIUM: Potassium: 3.5 mmol/L (ref 3.5–5.1)

## 2019-08-09 LAB — GLUCOSE, RANDOM: Glucose, Bld: 111 mg/dL — ABNORMAL HIGH (ref 70–99)

## 2019-08-09 MED ORDER — METHYLPREDNISOLONE 8 MG PO TABS: ORAL_TABLET | ORAL | 0 refills | Status: DC

## 2019-08-09 MED ORDER — HYDROXYZINE HCL 50 MG PO TABS: 50.0000 mg | ORAL_TABLET | Freq: Four times a day (QID) | ORAL | 0 refills | Status: DC | PRN

## 2019-08-09 MED ORDER — POLYETHYLENE GLYCOL 3350 17 G PO PACK: 17.0000 g | PACK | Freq: Every day | ORAL | 0 refills | Status: DC | PRN

## 2019-08-09 MED ORDER — ONDANSETRON 4 MG PO TBDP: 4.0000 mg | ORAL_TABLET | Freq: Three times a day (TID) | ORAL | 1 refills | Status: DC | PRN

## 2019-08-09 MED ORDER — VITAMIN B-6 100 MG PO TABS: 100.0000 mg | ORAL_TABLET | Freq: Every day | ORAL | 2 refills | Status: DC

## 2019-08-09 MED ORDER — METHYLPREDNISOLONE 4 MG PO TABS: ORAL_TABLET | ORAL | 0 refills | Status: DC

## 2019-08-09 MED ORDER — METHYLPREDNISOLONE 16 MG PO TABS: ORAL_TABLET | ORAL | 0 refills | Status: DC

## 2019-08-09 MED ORDER — METOCLOPRAMIDE HCL 10 MG PO TABS: 10.0000 mg | ORAL_TABLET | Freq: Four times a day (QID) | ORAL | 2 refills | Status: DC

## 2019-08-09 NOTE — Progress Notes (Signed)
Patient discharged transported home

## 2019-08-09 NOTE — Discharge Instructions (Signed)
Candice Moore  Steroid Taper Schedule      Sunday Monday Tuesday Wednesday Thursday Friday Saturday  Sunday Monday Tuesday Wednesday  Morning 16 16 8 8 8 8 8 8 8 4 4   Afternoon 8 8 8 4 4 4 4       Evening 16 8 8 8 4         Total for day 40 32 24 20 16 12 12 8 8 4  4

## 2019-08-09 NOTE — Discharge Summary (Signed)
DC Summary Discharge Summary   Patient ID: Candice Moore [id number] 28 y.o. 03/31/1991  Admit date: 08/06/2019  Discharge date: 08/09/2019  Principal Diagnoses:  1) Hyperemesis gravidarum antepartum first trimester 2) Hypokalemia  Secondary Diagnoses:  none  Procedures performed during the hospitalization:  1) IV Fluid resuscitation  2) repletion of potassium 3) complex management of hyperemesis gravidarum with multiple medications 4) Fetal ultrasound 5) Gastroenterology consultation  HPI: Presents for continued bout of nausea and vomiting for last several weeks. Has tried "all kinds of medicines" thru her [facility] providers at [facility] as well as multiple ER visits, without success.  Weak, dizzy.  Prior 2 pregnancies she did mot have this problem. Has appt w [facility] for transfer and continued care, prior delivery w [facility] as well.  No pain, vag bleeding, other associated sx's.  NSVD x2, no other prior pregnancy complications.  Past Medical History:  Diagnosis Date  . Asthma   . GERD (gastroesophageal reflux disease)     Past Surgical History:  Procedure Laterality Date  . CHOLECYSTECTOMY      No Known Allergies  Social History   Tobacco Use  . Smoking status: Never Smoker  . Smokeless tobacco: Never Used  Substance Use Topics  . Alcohol use: No  . Drug use: No    Family History  Problem Relation Age of Onset  . Breast cancer Maternal Grandmother   . Colon cancer Maternal [facility] Course:  Patient was admitted and given IV fluid resuscitation. She was also given vitamin, including thiamine resuscitation.  She was started an anti-emetics, including a steroid taper.  She began improving and had no emesis on hospital days 3 and 4.  She had occasional nausea, that improved with taking clear liquids.  The patients potassium was normal. Her vital signs were normal. She was otherwise not requiring fluid and voiding frequently.  She did have  bowel movements while at the hospital (patient stated she had not had a bowel movement for two months).  She was seen by GI for transaminitis and constipation.  Her constipation had resolved at the time of the consult.  GI states that her transaminitis was likely due to her repeated emesis.  She had elevated fasting blood glucose values (110s) while taking the steroid taper.  She was determined to be stable for discharge.   Discharge Exam: BP 104/61 (BP Location: Right Arm)   Pulse 82   Temp 98.6 F (37 C) (Oral)   Resp 18   Ht 5\' 2"  (1.575 m)   Wt 101 kg   LMP 05/04/2019   SpO2 100%   BMI 40.73 kg/m  Physical Exam Constitutional:      General: She is not in acute distress.    Appearance: Normal appearance. She is well-developed.  HENT:     Head: Normocephalic and atraumatic.  Eyes:     General: No scleral icterus.    Conjunctiva/sclera: Conjunctivae normal.  Neck:     Musculoskeletal: Normal range of motion and neck supple.  Cardiovascular:     Rate and Rhythm: Normal rate and regular rhythm.     Heart sounds: No murmur. No friction rub. No gallop.   Pulmonary:     Effort: Pulmonary effort is normal. No respiratory distress.     Breath sounds: Normal breath sounds. No wheezing or rales.  Abdominal:     General: Bowel sounds are normal. There is no distension.     Palpations: Abdomen is soft. There is no  mass.     Tenderness: There is no abdominal tenderness. There is no guarding or rebound.  Musculoskeletal: Normal range of motion.  Neurological:     General: No focal deficit present.     Mental Status: She is alert and oriented to person, place, and time.     Cranial Nerves: No cranial nerve deficit.  Skin:    General: Skin is warm and dry.     Findings: No erythema.  Psychiatric:        Mood and Affect: Mood normal.        Behavior: Behavior normal.        Judgment: Judgment normal.      Condition at Discharge: Improved   Complications affecting treatment:  None  Discharge Medications:  Allergies as of 08/09/2019   No Known Allergies     Medication List    STOP taking these medications   promethazine 25 MG tablet Commonly known as: PHENERGAN     TAKE these medications   famotidine 20 MG tablet Commonly known as: PEPCID Take 20 mg by mouth 2 (two) times daily.   hydrOXYzine 50 MG tablet Commonly known as: ATARAX/VISTARIL Take 1 tablet (50 mg total) by mouth every 6 (six) hours as needed for nausea or vomiting.   methylPREDNISolone 16 MG tablet Commonly known as: MEDROL As directed   methylPREDNISolone 8 MG tablet Commonly known as: MEDROL Use as directed   methylPREDNISolone 4 MG tablet Commonly known as: MEDROL As directed   metoCLOPramide 10 MG tablet Commonly known as: REGLAN Take 1 tablet (10 mg total) by mouth every 6 (six) hours. What changed:   when to take this  reasons to take this   ondansetron 4 MG disintegrating tablet Commonly known as: ZOFRAN-ODT Take 1 tablet (4 mg total) by mouth every 8 (eight) hours as needed.   polyethylene glycol 17 g packet Commonly known as: MIRALAX / GLYCOLAX Take 17 g by mouth daily as needed (constipation).   pyridOXINE 100 MG tablet Commonly known as: VITAMIN B-6 Take 1 tablet (100 mg total) by mouth daily.      Steroid Taper:     Tahliyah Anagnos  Steroid Taper Schedule      Sunday Monday Tuesday Wednesday Thursday Friday Saturday  Sunday Monday Tuesday Wednesday  Morning 16 16 8 8 8 8 8 8 8 4 4   Afternoon 8 8 8 4 4 4 4       Evening 16 8 8 8 4         Total for day 40 32 24 20 16 12 12 8 8 4  4      Follow-up arrangements:  [facility]. Schedule an appointment as soon as possible for a visit in 1 day(s).   Specialty: Obstetrics and Gynecology Why: Hospital follow up 1-3 days at [facility] information: [address] [place] [id number] [phone]           Discharge  Disposition: Discharge disposition: 01-Home or Self Care    Signed: Prentice Docker, MD  08/09/2019 10:46 AM

## 2019-08-09 NOTE — Progress Notes (Signed)
Candice Antigua, MD [address], [place], [place], [place], [address] [address], [place], [place], [place], [address] Phone: [phone]  Fax: [phone]   Subjective: Constipation has resolved.  Nausea vomiting has resolved.  Had 3 bowel movements yesterday   Objective: Exam: Vital signs in last 24 hours: Vitals:   08/08/19 2020 08/09/19 0109 08/09/19 0238 08/09/19 0749  BP: 127/64 110/61 (!) 97/50 104/61  Pulse: 88 73 72 82  Resp: 17 16 18 18   Temp: 98.3 F (36.8 C) 98.3 F (36.8 C) 97.6 F (36.4 C) 98.6 F (37 C)  TempSrc: Oral Oral Oral Oral  SpO2: 99% 99% 98% 100%  Weight:  101 kg    Height:       Weight change: 0.528 kg  Intake/Output Summary (Last 24 hours) at 08/09/2019 3474 Last data filed at 08/09/2019 0600 Gross per 24 hour  Intake 1450.36 ml  Output 5660 ml  Net -4209.64 ml    General: No acute distress, AAO x3 Abd: Soft, NT/ND, No HSM Skin: Warm, no rashes Neck: Supple, Trachea midline   Lab Results: Lab Results  Component Value Date   WBC 9.0 08/06/2019   HGB 13.6 08/06/2019   HCT 39.6 08/06/2019   MCV 77.6 (L) 08/06/2019   PLT 377 08/06/2019   Micro Results: Recent Results (from the past 240 hour(s))  GC/Chlamydia Probe Amp ([facility] send-out)      Status: None   Collection Time: 08/06/19  2:51 AM   Specimen: Urine  Result Value Ref Range Status   Chlamydia trachomatis, NAA Negative Negative Final   Neisseria Gonorrhoeae by PCR Negative Negative Final    Comment: (NOTE) Performed At: [facility] [address] [place], [place] [id number] Rush Farmer MD [id number]    CT/NG NAA Source URINE, RANDOM  Final    Comment: Performed at [facility], [address] [place], [place] [address]  Wet prep, genital     Status: Abnormal   Collection Time: 08/06/19  2:51 AM  Result Value Ref Range Status   Yeast Wet Prep HPF POC NONE SEEN NONE SEEN Final   Trich, Wet Prep NONE SEEN NONE SEEN Final   Clue  Cells Wet Prep HPF POC NONE SEEN NONE SEEN Final   WBC, Wet Prep HPF POC FEW (A) NONE SEEN Final   Sperm NONE SEEN  Final    Comment: Performed at [facility], [address] [place], [place] [address]  SARS CORONAVIRUS 2 (TAT 6-24 HRS) Nasopharyngeal Nasopharyngeal Swab     Status: None   Collection Time: 08/06/19  5:41 AM   Specimen: Nasopharyngeal Swab  Result Value Ref Range Status   SARS Coronavirus 2 NEGATIVE NEGATIVE Final    Comment: (NOTE) SARS-CoV-2 target nucleic acids are NOT DETECTED. The SARS-CoV-2 RNA is generally detectable in upper and lower respiratory specimens during the acute phase of infection. Negative results do not preclude SARS-CoV-2 infection, do not rule out co-infections with other pathogens, and should not be used as the sole basis for treatment or other patient management decisions. Negative results must be combined with clinical observations, patient history, and epidemiological information. The expected result is Negative. Fact Sheet for Patients: SugarRoll.be Fact Sheet for Healthcare Providers: https://www.woods-mathews.com/ This test is not yet approved or cleared by the [place] FDA and  has been authorized for detection and/or diagnosis of SARS-CoV-2 by FDA under an Emergency Use Authorization (EUA). This EUA will remain  in effect (meaning this test can be used) for  the duration of the COVID-19 declaration under Section 56 4(b)(1) of the Act, 21 U.S.C. section 360bbb-3(b)(1), unless the authorization is terminated or revoked sooner. Performed at [facility], [address] [place], [place] [address]    Studies/Results: No results found. Medications:  Scheduled Meds: . famotidine  20 mg Oral Q12H  . methylPREDNISolone  16 mg Oral Q breakfast   Followed by  . [START ON 08/11/2019] methylPREDNISolone  8 mg Oral Q breakfast   Followed by  . [START ON 08/18/2019]  methylPREDNISolone  4 mg Oral Q breakfast  . methylPREDNISolone  16 mg Oral QHS   Followed by  . [START ON 08/10/2019] methylPREDNISolone  8 mg Oral QHS   Followed by  . [START ON 08/13/2019] methylPREDNISolone  4 mg Oral QHS  . methylPREDNISolone  8 mg Oral Q1400   Followed by  . [START ON 08/12/2019] methylPREDNISolone  4 mg Oral Q1400  . metoCLOPramide  10 mg Oral Q6H   Or  . metoCLOPramide (REGLAN) injection  10 mg Intravenous Q6H  . polyethylene glycol  17 g Oral Daily  . prenatal multivitamin  1 tablet Oral Q1200  . pyridOXINE  100 mg Intravenous Daily   Continuous Infusions: . famotidine (PEPCID) IV Stopped (08/07/19 0920)  . lactated ringers with kcl 10 mL/hr at 08/08/19 1638   PRN Meds:.acetaminophen, hydrOXYzine, ondansetron (ZOFRAN) IV, zolpidem   Assessment: Active Problems:   Hyperemesis affecting pregnancy, antepartum   Hyperemesis complicating pregnancy, antepartum   Hypokalemia   Elevated liver enzymes    Plan: Constipation has resolved Continue high-fiber diet  Repeat liver enzymes today show normalization of alkaline phosphatase.  Transaminases improving.  Bilirubin normal.  Elevated liver enzymes were likely due to her nausea vomiting   Would recommend PCP or OB to continue to follow liver enzymes closely until completely normalized  Avoid hepatotoxic drugs  GI service will sign off at this time, please page with any questions or concerns   LOS: 2 days   Melodie Bouillon, MD 08/09/2019, 9:22 AM

## 2019-08-09 NOTE — Progress Notes (Signed)
Patient A&Ox4 VS stable. No vomiting x2 days. BM this morning. Discharge information reviewed with patient. Patient stated understanding. IV removed. Awaiting transport home.

## 2019-08-11 ENCOUNTER — Ambulatory Visit (INDEPENDENT_AMBULATORY_CARE_PROVIDER_SITE_OTHER): Payer: 59 | Admitting: Obstetrics and Gynecology

## 2019-08-11 ENCOUNTER — Other Ambulatory Visit: Payer: Self-pay

## 2019-08-11 ENCOUNTER — Encounter: Payer: Self-pay | Admitting: Obstetrics and Gynecology

## 2019-08-11 VITALS — BP 110/68 | Ht 62.0 in | Wt 227.0 lb

## 2019-08-11 DIAGNOSIS — E876 Hypokalemia: Secondary | ICD-10-CM

## 2019-08-11 DIAGNOSIS — O210 Mild hyperemesis gravidarum: Secondary | ICD-10-CM

## 2019-08-11 NOTE — Progress Notes (Signed)
Obstetrics & Gynecology Office Visit   Chief Complaint  Patient presents with  . Follow-up    Feeling better on medication    History of Present Illness: 28 y.o. G4P2011 at [redacted]w[redacted]d GA by LMP confirmed by 7 week ultrasound presents in follow up from a hospitalization from 11/12 - 11/15 for hyperemesis gravidarum and hypokalemia.  She was discharged on famotidine bid, a methylprednisolone taper, Reglan Q 6 hours, pyridoxine 100 mg Q day, zofran 4mg  Q 8 hours prn, and atarax prn.  She states that she is doing very well. She is able to tolerate oral liquid and now some solid foods. She is keeping her diet bland and doing well.  She has no acute complaints at this time.  She continues to receive her prenatal care at [facility], but already has a transfer of care appointment scheduled with [place] on 11/24.      Past Medical History:  Diagnosis Date  . Asthma   . GERD (gastroesophageal reflux disease)     Past Surgical History:  Procedure Laterality Date  . CHOLECYSTECTOMY      Gynecologic History: Patient's last menstrual period was 05/04/2019.  Obstetric History: [id number]  Family History  Problem Relation Age of Onset  . Breast cancer Maternal Grandmother   . Colon cancer Maternal Grandfather     Social History   Socioeconomic History  . Marital status: Single    Spouse name: Not on file  . Number of children: Not on file  . Years of education: Not on file  . Highest education level: Not on file  Occupational History  . Not on file  Social Needs  . Financial resource strain: Not on file  . Food insecurity    Worry: Not on file    Inability: Not on file  . Transportation needs    Medical: Not on file    Non-medical: Not on file  Tobacco Use  . Smoking status: Never Smoker  . Smokeless tobacco: Never Used  Substance and Sexual Activity  . Alcohol use: No  . Drug use: No  . Sexual activity: Yes  Lifestyle  . Physical activity    Days per week: Not on file   Minutes per session: Not on file  . Stress: Not on file  Relationships  . Social Herbalist on phone: Not on file    Gets together: Not on file    Attends religious service: Not on file    Active member of club or organization: Not on file    Attends meetings of clubs or organizations: Not on file    Relationship status: Not on file  . Intimate partner violence    Fear of current or ex partner: Not on file    Emotionally abused: Not on file    Physically abused: Not on file    Forced sexual activity: Not on file  Other Topics Concern  . Not on file  Social History Narrative  . Not on file    No Known Allergies  Prior to Admission medications   Medication Sig Start Date End Date Taking? Authorizing Provider  famotidine (PEPCID) 20 MG tablet Take 20 mg by mouth 2 (two) times daily. 07/23/19  Yes [provider]  hydrOXYzine (ATARAX/VISTARIL) 50 MG tablet Take 1 tablet (50 mg total) by mouth every 6 (six) hours as needed for nausea or vomiting. 08/09/19  Yes Will Bonnet, MD  methylPREDNISolone (MEDROL) 16 MG tablet As directed 08/09/19  Yes Glennon Mac,  Mila Homer, MD  Prenatal Vit-Fe Fumarate-FA (MULTIVITAMIN-PRENATAL) 27-0.8 MG TABS tablet Take 1 tablet by mouth daily at 12 noon.   Yes [provider]  pyridOXINE (VITAMIN B-6) 100 MG tablet Take 1 tablet (100 mg total) by mouth daily. 08/09/19  Yes Conard Novak, MD  methylPREDNISolone (MEDROL) 4 MG tablet As directed Patient not taking: Reported on 08/11/2019 08/09/19   Conard Novak, MD  methylPREDNISolone (MEDROL) 8 MG tablet Use as directed Patient not taking: Reported on 08/11/2019 08/09/19   Conard Novak, MD  metoCLOPramide (REGLAN) 10 MG tablet Take 1 tablet (10 mg total) by mouth every 6 (six) hours. Patient not taking: Reported on 08/11/2019 08/09/19 09/08/19  Conard Novak, MD  ondansetron (ZOFRAN-ODT) 4 MG disintegrating tablet Take 1 tablet (4 mg total) by mouth every 8  (eight) hours as needed. Patient not taking: Reported on 08/11/2019 08/09/19   Conard Novak, MD  polyethylene glycol (MIRALAX / GLYCOLAX) 17 g packet Take 17 g by mouth daily as needed (constipation). Patient not taking: Reported on 08/11/2019 08/09/19   Conard Novak, MD   Review of Systems  Constitutional: Negative.   HENT: Negative.   Eyes: Negative.   Respiratory: Negative.   Cardiovascular: Negative.   Gastrointestinal: Negative.   Genitourinary: Negative.   Musculoskeletal: Negative.   Skin: Negative.   Neurological: Negative.   Psychiatric/Behavioral: Negative.      Physical Exam BP 110/68   Ht 5\' 2"  (1.575 m)   Wt 227 lb (103 kg)   LMP 05/04/2019   BMI 41.52 kg/m  Patient's last menstrual period was 05/04/2019. Physical Exam Constitutional:      General: She is not in acute distress.    Appearance: Normal appearance.  HENT:     Head: Normocephalic and atraumatic.  Eyes:     General: No scleral icterus.    Conjunctiva/sclera: Conjunctivae normal.  Neurological:     General: No focal deficit present.     Mental Status: She is alert and oriented to person, place, and time.     Cranial Nerves: No cranial nerve deficit.  Psychiatric:        Mood and Affect: Mood normal.        Behavior: Behavior normal.        Judgment: Judgment normal.    Assessment: 28 y.o. 35 female here for  1. Hyperemesis affecting pregnancy, antepartum   2. Hypokalemia      Plan: Problem List Items Addressed This Visit      Digestive   Hyperemesis affecting pregnancy, antepartum - Primary     Other   Hypokalemia    Much improved. Will keep follow up appointment for establishment of care in 1 week. All OB related issues in the mean time she will cover through [id number] where she has established care.  15 minutes spent in face to face discussion with > 50% spent in counseling,management, and coordination of care of her hyperemesis gravidarum, hypokalemia.    [facility], MD 08/11/2019 10:13 AM

## 2019-08-17 ENCOUNTER — Telehealth: Payer: Self-pay

## 2019-08-17 NOTE — Telephone Encounter (Signed)
Pt called triage line stating she is experiencing spotting with slight period type dull cramping.   I spoke to pt she states she did have intercourse on Saturday. She only noticed light spotting on toilet tissue once this morning. I explained about cervical vascularity and after any pelvic interaction (sex or pelvic exam) she may notice spotting. Advised pelvic rest and to keep her appointment for tomorrow here at [facility]. IF S&S worsen pt aware to go to ER
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
## 2019-08-18 ENCOUNTER — Other Ambulatory Visit: Payer: Self-pay

## 2019-08-18 ENCOUNTER — Encounter: Payer: Self-pay | Admitting: Certified Nurse Midwife

## 2019-08-18 ENCOUNTER — Other Ambulatory Visit (HOSPITAL_COMMUNITY)
Admission: RE | Admit: 2019-08-18 | Discharge: 2019-08-18 | Disposition: A | Discharge status: Home / Self Care | Payer: 59 | Source: Ambulatory Visit | Attending: Certified Nurse Midwife | Admitting: Certified Nurse Midwife

## 2019-08-18 ENCOUNTER — Ambulatory Visit (INDEPENDENT_AMBULATORY_CARE_PROVIDER_SITE_OTHER): Payer: 59 | Admitting: Certified Nurse Midwife

## 2019-08-18 VITALS — BP 106/50 | Temp 96.9°F | Ht 62.0 in | Wt 230.0 lb

## 2019-08-18 DIAGNOSIS — O09299 Supervision of pregnancy with other poor reproductive or obstetric history, unspecified trimester: Secondary | ICD-10-CM

## 2019-08-18 DIAGNOSIS — Z124 Encounter for screening for malignant neoplasm of cervix: Secondary | ICD-10-CM

## 2019-08-18 DIAGNOSIS — O0992 Supervision of high risk pregnancy, unspecified, second trimester: Secondary | ICD-10-CM

## 2019-08-18 DIAGNOSIS — O0990 Supervision of high risk pregnancy, unspecified, unspecified trimester: Secondary | ICD-10-CM

## 2019-08-18 DIAGNOSIS — Z131 Encounter for screening for diabetes mellitus: Secondary | ICD-10-CM

## 2019-08-18 DIAGNOSIS — Z3A15 15 weeks gestation of pregnancy: Secondary | ICD-10-CM

## 2019-08-18 DIAGNOSIS — O99212 Obesity complicating pregnancy, second trimester: Secondary | ICD-10-CM

## 2019-08-18 DIAGNOSIS — R7401 Elevation of levels of liver transaminase levels: Secondary | ICD-10-CM

## 2019-08-18 DIAGNOSIS — O09292 Supervision of pregnancy with other poor reproductive or obstetric history, second trimester: Secondary | ICD-10-CM

## 2019-08-18 DIAGNOSIS — Z6841 Body Mass Index (BMI) 40.0 and over, adult: Secondary | ICD-10-CM

## 2019-08-18 DIAGNOSIS — O99210 Obesity complicating pregnancy, unspecified trimester: Secondary | ICD-10-CM

## 2019-08-18 DIAGNOSIS — O210 Mild hyperemesis gravidarum: Secondary | ICD-10-CM

## 2019-08-18 LAB — POCT URINALYSIS DIPSTICK OB
Glucose, UA: NEGATIVE
POC,PROTEIN,UA: NEGATIVE

## 2019-08-18 NOTE — Progress Notes (Signed)
C/O in hosp for a few days for N/V.rj

## 2019-08-19 ENCOUNTER — Encounter: Payer: Self-pay | Admitting: Certified Nurse Midwife

## 2019-08-19 DIAGNOSIS — O0990 Supervision of high risk pregnancy, unspecified, unspecified trimester: Secondary | ICD-10-CM | POA: Insufficient documentation

## 2019-08-19 DIAGNOSIS — O99210 Obesity complicating pregnancy, unspecified trimester: Secondary | ICD-10-CM | POA: Insufficient documentation

## 2019-08-19 DIAGNOSIS — O09299 Supervision of pregnancy with other poor reproductive or obstetric history, unspecified trimester: Secondary | ICD-10-CM | POA: Insufficient documentation

## 2019-08-19 DIAGNOSIS — Z6841 Body Mass Index (BMI) 40.0 and over, adult: Secondary | ICD-10-CM | POA: Insufficient documentation

## 2019-08-19 LAB — URINE DRUG PANEL 7
Amphetamines, Urine: NEGATIVE ng/mL
Barbiturate Quant, Ur: NEGATIVE ng/mL
Benzodiazepine Quant, Ur: NEGATIVE ng/mL
Cannabinoid Quant, Ur: NEGATIVE ng/mL
Cocaine (Metab.): NEGATIVE ng/mL
Opiate Quant, Ur: NEGATIVE ng/mL
PCP Quant, Ur: NEGATIVE ng/mL

## 2019-08-21 LAB — CYTOLOGY - PAP: Diagnosis: NEGATIVE

## 2019-08-24 ENCOUNTER — Other Ambulatory Visit: Payer: Self-pay

## 2019-08-24 DIAGNOSIS — O210 Mild hyperemesis gravidarum: Secondary | ICD-10-CM

## 2019-08-24 MED ORDER — HYDROXYZINE HCL 50 MG PO TABS: 50.0000 mg | ORAL_TABLET | Freq: Four times a day (QID) | ORAL | 0 refills | Status: DC | PRN

## 2019-08-24 NOTE — Telephone Encounter (Signed)
advise

## 2019-08-31 ENCOUNTER — Other Ambulatory Visit: Payer: Self-pay | Admitting: Obstetrics and Gynecology

## 2019-08-31 DIAGNOSIS — O210 Mild hyperemesis gravidarum: Secondary | ICD-10-CM

## 2019-08-31 NOTE — Telephone Encounter (Signed)
Please advise 

## 2019-09-04 ENCOUNTER — Other Ambulatory Visit: Payer: 59

## 2019-09-04 ENCOUNTER — Other Ambulatory Visit: Payer: Self-pay

## 2019-09-04 ENCOUNTER — Ambulatory Visit (INDEPENDENT_AMBULATORY_CARE_PROVIDER_SITE_OTHER): Payer: 59 | Admitting: Certified Nurse Midwife

## 2019-09-04 VITALS — BP 100/60 | Temp 97.0°F | Wt 236.0 lb

## 2019-09-04 DIAGNOSIS — R7401 Elevation of levels of liver transaminase levels: Secondary | ICD-10-CM

## 2019-09-04 DIAGNOSIS — Z3A17 17 weeks gestation of pregnancy: Secondary | ICD-10-CM

## 2019-09-04 DIAGNOSIS — Z1379 Encounter for other screening for genetic and chromosomal anomalies: Secondary | ICD-10-CM

## 2019-09-04 DIAGNOSIS — O0992 Supervision of high risk pregnancy, unspecified, second trimester: Secondary | ICD-10-CM

## 2019-09-04 DIAGNOSIS — O0990 Supervision of high risk pregnancy, unspecified, unspecified trimester: Secondary | ICD-10-CM

## 2019-09-04 DIAGNOSIS — Z131 Encounter for screening for diabetes mellitus: Secondary | ICD-10-CM

## 2019-09-04 DIAGNOSIS — Z363 Encounter for antenatal screening for malformations: Secondary | ICD-10-CM

## 2019-09-04 LAB — POCT URINALYSIS DIPSTICK OB
Glucose, UA: NEGATIVE
POC,PROTEIN,UA: NEGATIVE

## 2019-09-05 LAB — HEPATIC FUNCTION PANEL
ALT: 31 IU/L (ref 0–32)
AST: 26 IU/L (ref 0–40)
Albumin: 3.6 g/dL — ABNORMAL LOW (ref 3.9–5.0)
Alkaline Phosphatase: 81 IU/L (ref 39–117)
Bilirubin Total: <0.2 mg/dL (ref 0.0–1.2)
Bilirubin, Direct: 0.08 mg/dL (ref 0.00–0.40)
Total Protein: 6.3 g/dL (ref 6.0–8.5)

## 2019-09-05 LAB — GLUCOSE, 1 HOUR GESTATIONAL: Gestational Diabetes Screen: 130 mg/dL (ref 65–139)

## 2019-09-06 NOTE — Progress Notes (Signed)
HROB at 17wk4d: Nausea and vomiting continues to abate. Has gained 6# in the last 2 weeks. Has felt some flutters. No vaginal bleeding Desires genetic testing today MaterniT 21 and MSAFP today along with 1 hour GTT RTO in 2-3 weeks for anatomy scan  Dalia Heading, CNM

## 2019-09-25 NOTE — L&D Delivery Note (Signed)
Delivery Note At 2:35 PM a viable female infant was delivered via Vaginal, Spontaneous (Presentation: Right Occiput Anterior).  APGAR: 8, 9; weight pending.   Placenta status: Spontaneous, Intact.  Cord: 3 vessels with the following complications: None.  Cord pH: n/a  Anesthesia: None Episiotomy: None Lacerations: None Suture Repair: n/a Est. Blood Loss (mL): 150  Mom to postpartum.  Baby to Couplet care / Skin to Skin.  Called to see patient.  Mom pushed to deliver a viable female infant.  The head followed by shoulders, which delivered without difficulty, and the rest of the body.  No nuchal cord noted.  Baby to mom's chest.  Cord clamped and cut after > 1 min delay.  Cord blood obtained.  Placenta delivered spontaneously, intact, with a 3-vessel cord.  No vaginal, cervical, or perineal lacerations. All counts correct.  Hemostasis obtained with IV pitocin and fundal massage. EBL 150 mL.     [name], MD 02/02/2020, 2:51 PM

## 2019-10-02 ENCOUNTER — Ambulatory Visit (INDEPENDENT_AMBULATORY_CARE_PROVIDER_SITE_OTHER): Payer: Medicaid Other

## 2019-10-02 ENCOUNTER — Ambulatory Visit (INDEPENDENT_AMBULATORY_CARE_PROVIDER_SITE_OTHER): Payer: Medicaid Other | Admitting: Certified Nurse Midwife

## 2019-10-02 ENCOUNTER — Other Ambulatory Visit: Payer: Self-pay

## 2019-10-02 ENCOUNTER — Encounter: Payer: Self-pay | Admitting: Certified Nurse Midwife

## 2019-10-02 VITALS — BP 110/64 | Wt 236.0 lb

## 2019-10-02 DIAGNOSIS — Z363 Encounter for antenatal screening for malformations: Secondary | ICD-10-CM | POA: Diagnosis not present

## 2019-10-02 DIAGNOSIS — O0990 Supervision of high risk pregnancy, unspecified, unspecified trimester: Secondary | ICD-10-CM

## 2019-10-02 DIAGNOSIS — Z3A21 21 weeks gestation of pregnancy: Secondary | ICD-10-CM

## 2019-10-02 DIAGNOSIS — O0992 Supervision of high risk pregnancy, unspecified, second trimester: Secondary | ICD-10-CM

## 2019-10-02 NOTE — Progress Notes (Signed)
ROB at 21wk4d: Doing well. Baby moving well. Had anatomy scan today CGA 21wk6d with normal anatomy, female infant, anterior placenta  ROB in 4 weeks.  Farrel Conners, CNM

## 2019-10-02 NOTE — Progress Notes (Signed)
ROB/anatomy No concerns Denies lof, no vb, Some FM

## 2019-10-07 ENCOUNTER — Other Ambulatory Visit: Payer: Self-pay

## 2019-10-07 DIAGNOSIS — O210 Mild hyperemesis gravidarum: Secondary | ICD-10-CM

## 2019-10-07 MED ORDER — HYDROXYZINE HCL 50 MG PO TABS: 50.0000 mg | ORAL_TABLET | Freq: Four times a day (QID) | ORAL | 0 refills | Status: DC | PRN

## 2019-10-07 NOTE — Telephone Encounter (Signed)
Advise

## 2019-10-22 ENCOUNTER — Other Ambulatory Visit: Payer: Self-pay

## 2019-10-22 ENCOUNTER — Encounter: Payer: Self-pay | Admitting: Advanced Practice Midwife

## 2019-10-22 ENCOUNTER — Ambulatory Visit (INDEPENDENT_AMBULATORY_CARE_PROVIDER_SITE_OTHER): Payer: Medicaid Other | Admitting: Advanced Practice Midwife

## 2019-10-22 VITALS — BP 110/60 | Wt 241.0 lb

## 2019-10-22 DIAGNOSIS — O0992 Supervision of high risk pregnancy, unspecified, second trimester: Secondary | ICD-10-CM

## 2019-10-22 DIAGNOSIS — O99212 Obesity complicating pregnancy, second trimester: Secondary | ICD-10-CM

## 2019-10-22 DIAGNOSIS — Z3A24 24 weeks gestation of pregnancy: Secondary | ICD-10-CM

## 2019-10-22 DIAGNOSIS — O09292 Supervision of pregnancy with other poor reproductive or obstetric history, second trimester: Secondary | ICD-10-CM

## 2019-10-22 NOTE — Lactation Note (Signed)
Lactation Consultation Note  Patient Name: Candice Moore LKHVF'M Date: 10/22/2019     Maternal Data    Feeding    LATCH Score                   Interventions    Lactation Tools Discussed/Used     Consult Status  Lactation consultant discussed the benefits of breastfeeding per the Ready, Set, Baby Curriculum. Rogelio encouraged to review breastfeeding information on Ready, Set, Baby website and given information for virtual breastfeeding classes. [name] states she breastfed her first for 1 year but was not successful breastfeeding her 2nd child. She would like to try breastfeeding again.    Arlyss Gandy 10/22/2019, 11:25 AM

## 2019-10-22 NOTE — Progress Notes (Signed)
  Routine Prenatal Care Visit  Subjective  Candice Moore is a 29 y.o. [id number] at [redacted]w[redacted]d being seen today for ongoing prenatal care.  She is currently monitored for the following issues for this high-risk pregnancy and has Hyperemesis affecting pregnancy, antepartum; Hyperemesis complicating pregnancy, antepartum; Hypokalemia; Elevated liver enzymes; Supervision of high risk pregnancy, antepartum; History of shoulder dystocia in prior pregnancy, currently pregnant; BMI 40.0-44.9, adult (HCC); and Obesity in pregnancy on their problem list.  ----------------------------------------------------------------------------------- Patient reports episodes of dizziness about 3 times per week. She has been unable to eat much protein since being sick recently and notices the dizziness more on mornings when she doesn't eat well. She doesn't tolerate dairy and cannot tolerate eggs, chicken or other meats, or beans. She is able to eat some peanut butter. She admits some sinus congestion recently she thinks due to fans blowing on her at work. We discussed likely sources of dizziness including, hypotension, hypoglycemia, vertigo.     . Vag. Bleeding: None.  Movement: Present. Leaking Fluid denies.  ----------------------------------------------------------------------------------- The following portions of the patient's history were reviewed and updated as appropriate: allergies, current medications, past family history, past medical history, past social history, past surgical history and problem list. Problem list updated.  Objective  Blood pressure 110/60, weight 241 lb (109.3 kg), last menstrual period 05/04/2019. Pregravid weight 250 lb (113.4 kg) Total Weight Gain -9 lb (-4.082 kg) Urinalysis: Urine Protein    Urine Glucose    Fetal Status: Fetal Heart Rate (bpm): 143   Movement: Present     General:  Alert, oriented and cooperative. Patient is in no acute distress.  Skin: Skin is warm and dry. No rash  noted.   Cardiovascular: Normal heart rate noted  Respiratory: Normal respiratory effort, no problems with respiration noted  Abdomen: Soft, gravid, appropriate for gestational age.       Pelvic:  Cervical exam deferred        Extremities: Normal range of motion.     Mental Status: Normal mood and affect. Normal behavior. Normal judgment and thought content.   Assessment   29 y.o. V7O1607 at [redacted]w[redacted]d by  02/08/2020, by Last Menstrual Period presenting for work-in prenatal visit  Plan   FOURTH Problems (from 08/18/19 to present)    No problems associated with this episode.       Preterm labor symptoms and general obstetric precautions including but not limited to vaginal bleeding, contractions, leaking of fluid and fetal movement were reviewed in detail with the patient. Please refer to After Visit Summary for other counseling recommendations.  Increase hydration Increase protein intake- protein powder if unable to tolerate other sources Saline nasal spray, sudafed (pseudoephedrine) for sinus congestion  Return for follow up scheduled.  Tresea Mall, CNM 10/22/2019 11:18 AM

## 2019-10-22 NOTE — Progress Notes (Signed)
Pt was having some dizziness this AM. No vb. No lof.

## 2019-10-22 NOTE — Patient Instructions (Signed)
Hypotension As your heart beats, it forces blood through your body. Hypotension, commonly called low blood pressure, is when the force of blood pumping through your arteries is too weak. Arteries are blood vessels that carry blood from the heart throughout the body. Depending on the cause and severity, hypotension may be harmless (benign) or may cause serious problems (be critical). When blood pressure is too low, you may not get enough blood to your brain or to the rest of your organs. This can cause weakness, light-headedness, rapid heartbeat, and fainting. What are the causes? This condition may be caused by:  Blood loss.  Loss of body fluids (dehydration).  Heart problems.  Hormone (endocrine) problems.  Pregnancy.  Severe infection.  Lack of certain nutrients.  Severe allergic reactions (anaphylaxis).  Certain medicines, such as blood pressure medicine or medicines that make the body lose excess fluids (diuretics). Sometimes, hypotension may be caused by not taking medicine as directed, such as taking too much of a certain medicine. What increases the risk? The following factors may make you more likely to develop this condition:  Age. Risk increases as you get older.  Conditions that affect the heart or the central nervous system.  Taking certain medicines, such as blood pressure medicine or diuretics.  Being pregnant. What are the signs or symptoms? Common symptoms of this condition include:  Weakness.  Light-headedness.  Dizziness.  Blurred vision.  Fatigue.  Rapid heartbeat.  Fainting, in severe cases. How is this diagnosed? This condition is diagnosed based on:  Your medical history.  Your symptoms.  Your blood pressure measurement. Your health care provider will check your blood pressure when you are: ? Lying down. ? Sitting. ? Standing. A blood pressure reading is recorded as two numbers, such as "120 over 80" (or 120/80). The first ("top")  number is called the systolic pressure. It is a measure of the pressure in your arteries as your heart beats. The second ("bottom") number is called the diastolic pressure. It is a measure of the pressure in your arteries when your heart relaxes between beats. Blood pressure is measured in a unit called mm Hg. Healthy blood pressure for most adults is 120/80. If your blood pressure is below 90/60, you may be diagnosed with hypotension. Other information or tests that may be used to diagnose hypotension include:  Your other vital signs, such as your heart rate and temperature.  Blood tests.  Tilt table test. For this test, you will be safely secured to a table that moves you from a lying position to an upright position. Your heart rhythm and blood pressure will be monitored during the test. How is this treated? Treatment for this condition may include:  Changing your diet. This may involve eating more salt (sodium) or drinking more water.  Taking medicines to raise your blood pressure.  Changing the dosage of certain medicines you are taking that might be lowering your blood pressure.  Wearing compression stockings. These stockings help to prevent blood clots and reduce swelling in your legs. In some cases, you may need to go to the hospital for:  Fluid replacement. This means you will receive fluids through an IV.  Blood replacement. This means you will receive donated blood through an IV (transfusion).  Treating an infection or heart problems, if this applies.  Monitoring. You may need to be monitored while medicines that you are taking wear off. Follow these instructions at home: Eating and drinking   Drink enough fluid to keep your   urine pale yellow.  Eat a healthy diet, and follow instructions from your health care provider about eating or drinking restrictions. A healthy diet includes: ? Fresh fruits and vegetables. ? Whole grains. ? Lean meats. ? Low-fat dairy  products.  Eat extra salt only as directed. Do not add extra salt to your diet unless your health care provider told you to do that.  Eat frequent, small meals.  Avoid standing up suddenly after eating. Medicines  Take over-the-counter and prescription medicines only as told by your health care provider. ? Follow instructions from your health care provider about changing the dosage of your current medicines, if this applies. ? Do not stop or adjust any of your medicines on your own. General instructions   Wear compression stockings as told by your health care provider.  Get up slowly from lying down or sitting positions. This gives your blood pressure a chance to adjust.  Avoid hot showers and excessive heat as directed by your health care provider.  Return to your normal activities as told by your health care provider. Ask your health care provider what activities are safe for you.  Do not use any products that contain nicotine or tobacco, such as cigarettes, e-cigarettes, and chewing tobacco. If you need help quitting, ask your health care provider.  Keep all follow-up visits as told by your health care provider. This is important. Contact a health care provider if you:  Vomit.  Have diarrhea.  Have a fever for more than 2-3 days.  Feel more thirsty than usual.  Feel weak and tired. Get help right away if you:  Have chest pain.  Have a fast or irregular heartbeat.  Develop numbness in any part of your body.  Cannot move your arms or your legs.  Have trouble speaking.  Become sweaty or feel light-headed.  Faint.  Feel short of breath.  Have trouble staying awake.  Feel confused. Summary  Hypotension is when the force of blood pumping through your arteries is too weak.  Hypotension may be harmless (benign) or may cause serious problems (be critical).  Treatment for this condition may include changing your diet, changing your medicines, and wearing  compression stockings.  In some cases, you may need to go to the hospital for fluid or blood replacement. This information is not intended to replace advice given to you by your health care provider. Make sure you discuss any questions you have with your health care provider. Document Revised: 03/06/2018 Document Reviewed: 03/06/2018 Elsevier Patient Education  2020 Elsevier Inc.  

## 2019-10-30 ENCOUNTER — Encounter: Payer: Medicaid Other | Admitting: Obstetrics and Gynecology

## 2019-11-10 ENCOUNTER — Ambulatory Visit (INDEPENDENT_AMBULATORY_CARE_PROVIDER_SITE_OTHER): Payer: Medicaid Other | Admitting: Advanced Practice Midwife

## 2019-11-10 ENCOUNTER — Encounter: Payer: Self-pay | Admitting: Advanced Practice Midwife

## 2019-11-10 ENCOUNTER — Other Ambulatory Visit: Payer: Self-pay

## 2019-11-10 VITALS — BP 122/74 | Wt 242.0 lb

## 2019-11-10 DIAGNOSIS — Z131 Encounter for screening for diabetes mellitus: Secondary | ICD-10-CM

## 2019-11-10 DIAGNOSIS — O09893 Supervision of other high risk pregnancies, third trimester: Secondary | ICD-10-CM

## 2019-11-10 DIAGNOSIS — O0990 Supervision of high risk pregnancy, unspecified, unspecified trimester: Secondary | ICD-10-CM

## 2019-11-10 DIAGNOSIS — Z3A27 27 weeks gestation of pregnancy: Secondary | ICD-10-CM

## 2019-11-10 DIAGNOSIS — Z113 Encounter for screening for infections with a predominantly sexual mode of transmission: Secondary | ICD-10-CM

## 2019-11-10 DIAGNOSIS — O99213 Obesity complicating pregnancy, third trimester: Secondary | ICD-10-CM

## 2019-11-10 DIAGNOSIS — Z130 Encounter for screening for diseases of the blood and blood-forming organs and certain disorders involving the immune mechanism: Secondary | ICD-10-CM

## 2019-11-10 NOTE — Progress Notes (Signed)
No vb. No lof.  

## 2019-11-10 NOTE — Progress Notes (Signed)
Routine Prenatal Care Visit  Subjective  Candice Moore is a 29 y.o. [id number] at [redacted]w[redacted]d being seen today for ongoing prenatal care.  She is currently monitored for the following issues for this high-risk pregnancy and has Hyperemesis affecting pregnancy, antepartum; Hyperemesis complicating pregnancy, antepartum; Hypokalemia; Elevated liver enzymes; Supervision of high risk pregnancy, antepartum; History of shoulder dystocia in prior pregnancy, currently pregnant; BMI 40.0-44.9, adult (HCC); and Obesity in pregnancy on their problem list.  ----------------------------------------------------------------------------------- Patient reports pressure in her pelvis especially when standing at work. She desires tubal ligation.    . Vag. Bleeding: None.  Movement: Present. Leaking Fluid denies.  ----------------------------------------------------------------------------------- The following portions of the patient's history were reviewed and updated as appropriate: allergies, current medications, past family history, past medical history, past social history, past surgical history and problem list. Problem list updated.  Objective  Blood pressure 122/74, weight 242 lb (109.8 kg), last menstrual period 05/04/2019. Pregravid weight 250 lb (113.4 kg) Total Weight Gain -8 lb (-3.629 kg) Urinalysis: Urine Protein    Urine Glucose    Tea color  Fetal Status: Fetal Heart Rate (bpm): 141 Fundal Height: 27 cm Movement: Present     General:  Alert, oriented and cooperative. Patient is in no acute distress.  Skin: Skin is warm and dry. No rash noted.   Cardiovascular: Normal heart rate noted  Respiratory: Normal respiratory effort, no problems with respiration noted  Abdomen: Soft, gravid, appropriate for gestational age.       Pelvic:  Cervical exam deferred        Extremities: Normal range of motion.  Edema: None  Mental Status: Normal mood and affect. Normal behavior. Normal judgment and thought  content.   The following were addressed during this visit:  Breastfeeding Education - Early initiation of breastfeeding    Comments: Keeps milk supply adequate, helps contract uterus and slow bleeding, and early milk is the perfect first food and is easy to digest.   - The importance of exclusive breastfeeding    Comments: Provides antibodies, Lower risk of breast and ovarian cancers, and type-2 diabetes,Helps your body recover, Reduced chance of SIDS.   - Risks of giving your baby anything other than breast milk if you are breastfeeding    Comments: Make the baby less content with breastfeeds, may make my baby more susceptible to illness, and may reduce my milk supply.   - The importance of early skin-to-skin contact    Comments: Keeps baby warm and secure, helps keep baby's blood sugar up and breathing steady, easier to bond and breastfeed, and helps calm baby.  - Rooming-in on a 24-hour basis    Comments: Easier to learn baby's feeding cues, easier to bond and get to know each other, and encourages milk production.   - Feeding on demand or baby-led feeding    Comments: Helps prevent breastfeeding complications, helps bring in good milk supply, prevents under or overfeeding, and helps baby feel content and satisfied   - Frequent feeding to help assure optimal milk production    Comments: Making a full supply of milk requires frequent removal of milk from breasts, infant will eat 8-12 times in 24 hours, if separated from infant use breast massage, hand expression and/ or pumping to remove milk from breasts.   - Effective positioning and attachment    Comments: Helps my baby to get enough breast milk, helps to produce an adequate milk supply, and helps prevent nipple pain and damage   - Exclusive breastfeeding for the  first 6 months    Comments: Builds a healthy milk supply and keeps it up, protects baby from sickness and disease, and breastmilk has everything your baby needs for  the first 6 months.   Assessment   29 y.o. [id number] at [redacted]w[redacted]d by  02/08/2020, by Last Menstrual Period presenting for routine prenatal visit  Plan   FOURTH Problems (from 08/18/19 to present)    No problems associated with this episode.     Tubal consent signed today   Preterm labor symptoms and general obstetric precautions including but not limited to vaginal bleeding, contractions, leaking of fluid and fetal movement were reviewed in detail with the patient.  Increase hydration Wear abdominal support band to decrease pelvic pressure   Return in about 2 weeks (around 11/24/2019) for 28 wk labs and rob.  Rod Can, CNM 11/10/2019 11:11 AM

## 2019-11-16 ENCOUNTER — Other Ambulatory Visit (HOSPITAL_COMMUNITY)
Admission: RE | Admit: 2019-11-16 | Discharge: 2019-11-16 | Disposition: A | Discharge status: Home / Self Care | Payer: BLUE CROSS/BLUE SHIELD | Source: Ambulatory Visit | Attending: Obstetrics & Gynecology | Admitting: Obstetrics & Gynecology

## 2019-11-16 ENCOUNTER — Ambulatory Visit (INDEPENDENT_AMBULATORY_CARE_PROVIDER_SITE_OTHER): Payer: BC Managed Care – PPO | Admitting: Obstetrics & Gynecology

## 2019-11-16 ENCOUNTER — Other Ambulatory Visit: Payer: Self-pay

## 2019-11-16 ENCOUNTER — Encounter: Payer: Self-pay | Admitting: Obstetrics & Gynecology

## 2019-11-16 DIAGNOSIS — Z3A28 28 weeks gestation of pregnancy: Secondary | ICD-10-CM

## 2019-11-16 DIAGNOSIS — O3463 Maternal care for abnormality of vagina, third trimester: Secondary | ICD-10-CM

## 2019-11-16 DIAGNOSIS — N760 Acute vaginitis: Secondary | ICD-10-CM | POA: Insufficient documentation

## 2019-11-16 NOTE — Progress Notes (Signed)
HPI:      [name] is a 29 y.o. [phone] who LMP was Patient's last menstrual period was 05/04/2019 (approximate)., presents today for a problem visit.  She complains of:  Patient complains mostly of worsening lower pelvic ctx-like pains that is 1 per hour or so and is worsened by work (she stands up on shift work), she had the same problem last pregnancy w same job, led to early dilation but not PTL/PTD; but also of an abnormal vaginal discharge for 3 days. Vaginal symptoms include discharge described as mucus like.Vulvar symptoms include none.STI Risk: Very low risk of STD exposureDischarge described as: odorless.Other associated symptoms: none.  PMHx: She  has a past medical history of Anxiety, Asthma, GERD (gastroesophageal reflux disease), and Obesity affecting pregnancy. Also,  has a past surgical history that includes Cholecystectomy., family history includes Breast cancer in her maternal grandmother; Colon cancer in her maternal grandfather.,  reports that she has never smoked. She has never used smokeless tobacco. She reports that she does not drink alcohol or use drugs.  She has a current medication list which includes the following prescription(s): [facility], famotidine, hydroxyzine, ondansetron, and multivitamin-prenatal. Also, has No Known Allergies.  Review of Systems  Constitutional: Positive for malaise/fatigue. Negative for chills and fever.  HENT: Negative for congestion, sinus pain and sore throat.   Eyes: Negative for blurred vision and pain.  Respiratory: Negative for cough and wheezing.   Cardiovascular: Negative for chest pain and leg swelling.  Gastrointestinal: Positive for abdominal pain. Negative for constipation, diarrhea, heartburn, nausea and vomiting.  Genitourinary: Negative for dysuria, frequency, hematuria and urgency.  Musculoskeletal: Negative for back pain, joint pain, myalgias and neck pain.  Skin: Negative for itching and rash.  Neurological:  Negative for dizziness, tremors and weakness.  Endo/Heme/Allergies: Does not bruise/bleed easily.  Psychiatric/Behavioral: Negative for depression. The patient is not nervous/anxious and does not have insomnia.     Objective: BP 102/60   Wt 244 lb (110.7 kg)   LMP 05/04/2019 (Approximate)   BMI 44.63 kg/m  Physical Exam Constitutional:      General: She is not in acute distress.    Appearance: She is well-developed.  Genitourinary:     Pelvic exam was performed with patient supine.     Vagina and uterus normal.     No vaginal erythema or bleeding.     No cervical motion tenderness, discharge, polyp or nabothian cyst.     Uterus is mobile.     Uterus is not enlarged.     No uterine mass detected.    Uterus is midaxial.     No right or left adnexal mass present.     Right adnexa not tender.     Left adnexa not tender.     Genitourinary Comments: Cx closed  HENT:     Head: Normocephalic and atraumatic.     Nose: Nose normal.  Abdominal:     General: There is no distension.     Palpations: Abdomen is soft.     Tenderness: There is generalized abdominal tenderness.  Musculoskeletal:        General: Normal range of motion.  Neurological:     Mental Status: She is alert and oriented to person, place, and time.     Cranial Nerves: No cranial nerve deficit.  Skin:    General: Skin is warm and dry.  Psychiatric:        Attention and Perception: Attention normal.  Mood and Affect: Mood and affect normal.        Speech: Speech normal.        Behavior: Behavior normal.        Thought Content: Thought content normal.        Judgment: Judgment normal.    UA- NEG  ASSESSMENT/PLAN:    Problem List Items Addressed This Visit    Preterm labor in third trimester without delivery    -  Primary   Relevant Orders   Fetal fibronectin   Acute vaginitis       Relevant Orders   Cervicovaginal ancillary only    Monitor for s/sx PTL    Likely Candice Moore, monitor activity  and work levels Steroids and modified rest if fFN Pos Work restrictions discussed  A total of 20 minutes were spent face-to-face with the patient as well as preparation, review, communication, and documentation during this encounter.    Annamarie Major, MD, [name], [facility] 11/16/2019  2:28 PM

## 2019-11-16 NOTE — Patient Instructions (Signed)
Braxton Hicks Contractions °Contractions of the uterus can occur throughout pregnancy, but they are not always a sign that you are in labor. You may have practice contractions called Braxton Hicks contractions. These false labor contractions are sometimes confused with true labor. °What are Braxton Hicks contractions? °Braxton Hicks contractions are tightening movements that occur in the muscles of the uterus before labor. Unlike true labor contractions, these contractions do not result in opening (dilation) and thinning of the cervix. Toward the end of pregnancy (32-34 weeks), Braxton Hicks contractions can happen more often and may become stronger. These contractions are sometimes difficult to tell apart from true labor because they can be very uncomfortable. You should not feel embarrassed if you go to the hospital with false labor. °Sometimes, the only way to tell if you are in true labor is for your health care provider to look for changes in the cervix. The health care provider will do a physical exam and may monitor your contractions. If you are not in true labor, the exam should show that your cervix is not dilating and your water has not broken. °If there are no other health problems associated with your pregnancy, it is completely safe for you to be sent home with false labor. You may continue to have Braxton Hicks contractions until you go into true labor. °How to tell the difference between true labor and false labor °True labor °· Contractions last 30-70 seconds. °· Contractions become very regular. °· Discomfort is usually felt in the top of the uterus, and it spreads to the lower abdomen and low back. °· Contractions do not go away with walking. °· Contractions usually become more intense and increase in frequency. °· The cervix dilates and gets thinner. °False labor °· Contractions are usually shorter and not as strong as true labor contractions. °· Contractions are usually irregular. °· Contractions  are often felt in the front of the lower abdomen and in the groin. °· Contractions may go away when you walk around or change positions while lying down. °· Contractions get weaker and are shorter-lasting as time goes on. °· The cervix usually does not dilate or become thin. °Follow these instructions at home: ° °· Take over-the-counter and prescription medicines only as told by your health care provider. °· Keep up with your usual exercises and follow other instructions from your health care provider. °· Eat and drink lightly if you think you are going into labor. °· If Braxton Hicks contractions are making you uncomfortable: °? Change your position from lying down or resting to walking, or change from walking to resting. °? Sit and rest in a tub of warm water. °? Drink enough fluid to keep your urine pale yellow. Dehydration may cause these contractions. °? Do slow and deep breathing several times an hour. °· Keep all follow-up prenatal visits as told by your health care provider. This is important. °Contact a health care provider if: °· You have a fever. °· You have continuous pain in your abdomen. °Get help right away if: °· Your contractions become stronger, more regular, and closer together. °· You have fluid leaking or gushing from your vagina. °· You pass blood-tinged mucus (bloody show). °· You have bleeding from your vagina. °· You have low back pain that you never had before. °· You feel your baby’s head pushing down and causing pelvic pressure. °· Your baby is not moving inside you as much as it used to. °Summary °· Contractions that occur before labor are   called Braxton Hicks contractions, false labor, or practice contractions. °· Braxton Hicks contractions are usually shorter, weaker, farther apart, and less regular than true labor contractions. True labor contractions usually become progressively stronger and regular, and they become more frequent. °· Manage discomfort from Braxton Hicks contractions  by changing position, resting in a warm bath, drinking plenty of water, or practicing deep breathing. °This information is not intended to replace advice given to you by your health care provider. Make sure you discuss any questions you have with your health care provider. °Document Revised: 08/23/2017 Document Reviewed: 01/24/2017 °Elsevier Patient Education © 2020 Elsevier Inc. ° °

## 2019-11-17 LAB — FETAL FIBRONECTIN: Fetal Fibronectin: NEGATIVE

## 2019-11-17 NOTE — Progress Notes (Signed)
Let her know PTL Test is negative (fetal fibronectin)

## 2019-11-18 ENCOUNTER — Telehealth: Payer: Self-pay

## 2019-11-18 LAB — CERVICOVAGINAL ANCILLARY ONLY
Bacterial Vaginitis (gardnerella): NEGATIVE
Candida Glabrata: NEGATIVE
Candida Vaginitis: NEGATIVE
Chlamydia: NEGATIVE
Comment: NEGATIVE
Comment: NEGATIVE
Comment: NEGATIVE
Comment: NEGATIVE
Comment: NEGATIVE
Comment: NORMAL
Neisseria Gonorrhea: NEGATIVE
Trichomonas: NEGATIVE

## 2019-11-18 NOTE — Telephone Encounter (Signed)
Pt calling; was seen Mon; is back at work today; still having really bad ctxs.  Can PH help her get out of work asap?  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 Is afraid of delivering early and doesn't want that.

## 2019-11-18 NOTE — Telephone Encounter (Signed)
Hi Rita, Since [facility] saw her for this concern I think it would be best for him to follow up with her regarding whether she should be out of work? or maybe just needs extra hydration? or PT? or hip support band? or? I left her a voicemail essentially telling her all of this. Please have PH check on her tomorrow when he is back. Looks

## 2019-11-18 NOTE — Progress Notes (Signed)
Pt aware.

## 2019-11-19 NOTE — Telephone Encounter (Signed)
Pt aware via detailed vm.

## 2019-11-19 NOTE — Telephone Encounter (Signed)
No evidence for infection or preterm labor based on exam and labs from the other day.  Will need to discuss again at next visit.

## 2019-11-20 ENCOUNTER — Telehealth: Payer: Self-pay | Admitting: Advanced Practice Midwife

## 2019-11-20 NOTE — Telephone Encounter (Signed)
Left message for pt to call office back to confirm if the forms that we have here for her FMLA from [employer] are needed still. Please document when patient calls back if forms are needed or not.

## 2019-11-24 ENCOUNTER — Telehealth: Payer: Self-pay

## 2019-11-24 NOTE — Telephone Encounter (Signed)
Patient states she has been having contractions intermittently for 2 weeks. States she has been having contractions for 2 hours and having trouble standing/walking. She is drinking about 5-6 bottles of water a day. She has to stand for her job. . Advised can take tyelnol (2 regular strength or 1 Extra strength). Advised if having any leaking of fluid, bleeding or more than 4 contractions in an hour, she needs to report to L&D. 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 She will try tylenol and call back or discuss with provider at her apt on Thursday.

## 2019-11-24 NOTE — Telephone Encounter (Signed)
Patient having pain in her hips and a lot of contractions. Inquiring if she can take tylenol to help with the pain. Cb#[phone]

## 2019-11-26 ENCOUNTER — Other Ambulatory Visit: Payer: Self-pay

## 2019-11-26 ENCOUNTER — Other Ambulatory Visit: Payer: Medicaid Other

## 2019-11-26 ENCOUNTER — Encounter: Payer: Self-pay | Admitting: Advanced Practice Midwife

## 2019-11-26 ENCOUNTER — Ambulatory Visit (INDEPENDENT_AMBULATORY_CARE_PROVIDER_SITE_OTHER): Payer: Medicaid Other | Admitting: Advanced Practice Midwife

## 2019-11-26 VITALS — BP 110/70 | Wt 243.0 lb

## 2019-11-26 DIAGNOSIS — Z131 Encounter for screening for diabetes mellitus: Secondary | ICD-10-CM

## 2019-11-26 DIAGNOSIS — Z3A29 29 weeks gestation of pregnancy: Secondary | ICD-10-CM

## 2019-11-26 DIAGNOSIS — Z130 Encounter for screening for diseases of the blood and blood-forming organs and certain disorders involving the immune mechanism: Secondary | ICD-10-CM

## 2019-11-26 DIAGNOSIS — O09893 Supervision of other high risk pregnancies, third trimester: Secondary | ICD-10-CM

## 2019-11-26 DIAGNOSIS — O99213 Obesity complicating pregnancy, third trimester: Secondary | ICD-10-CM

## 2019-11-26 DIAGNOSIS — O0990 Supervision of high risk pregnancy, unspecified, unspecified trimester: Secondary | ICD-10-CM

## 2019-11-26 DIAGNOSIS — Z113 Encounter for screening for infections with a predominantly sexual mode of transmission: Secondary | ICD-10-CM

## 2019-11-26 LAB — POCT URINALYSIS DIPSTICK OB
Glucose, UA: NEGATIVE
POC,PROTEIN,UA: NEGATIVE

## 2019-11-26 MED ORDER — PRENATAL 27-0.8 MG PO TABS: 1.0000 | ORAL_TABLET | Freq: Every day | ORAL | 12 refills | Status: DC

## 2019-11-26 NOTE — Lactation Note (Signed)
Lactation Consultation Note  Patient Name: Candice Moore [id number] Date: 11/26/2019     Lactation student reinforced benefits of breastfeeding per the Ready, Set, Baby curriculum.  Candice Moore encouraged to review breastfeeding information on Ready, set, [employer] site.    Maternal Data    Feeding    LATCH Score                   Interventions    Lactation Tools Discussed/Used     Consult Status      Candice Moore 11/26/2019, 9:22 AM

## 2019-11-26 NOTE — Patient Instructions (Signed)

## 2019-11-26 NOTE — Progress Notes (Signed)
  Routine Prenatal Care Visit  Subjective  Candice Moore is a 29 y.o. [id number] at [redacted]w[redacted]d being seen today for ongoing prenatal care.  She is currently monitored for the following issues for this high-risk pregnancy and has Hyperemesis affecting pregnancy, antepartum; Hyperemesis complicating pregnancy, antepartum; Hypokalemia; Elevated liver enzymes; Supervision of high risk pregnancy, antepartum; History of shoulder dystocia in prior pregnancy, currently pregnant; BMI 40.0-44.9, adult (HCC); and Obesity in pregnancy on their problem list.  ----------------------------------------------------------------------------------- Patient reports 5 contractions in 1 hour on Sunday. She had something to drink and took a shower and contractions stopped. She understands the importance of hydrating.    . Vag. Bleeding: None.  Movement: Present. Leaking Fluid denies.  ----------------------------------------------------------------------------------- The following portions of the patient's history were reviewed and updated as appropriate: allergies, current medications, past family history, past medical history, past social history, past surgical history and problem list. Problem list updated.  Objective  Blood pressure 110/70, weight 243 lb (110.2 kg), last menstrual period 05/04/2019. Pregravid weight 250 lb (113.4 kg) Total Weight Gain -7 lb (-3.175 kg) Urinalysis: Urine Protein    Urine Glucose    Fetal Status: Fetal Heart Rate (bpm): 139 Fundal Height: 29 cm Movement: Present     General:  Alert, oriented and cooperative. Patient is in no acute distress.  Skin: Skin is warm and dry. No rash noted.   Cardiovascular: Normal heart rate noted  Respiratory: Normal respiratory effort, no problems with respiration noted  Abdomen: Soft, gravid, appropriate for gestational age. Pain/Pressure: Present     Pelvic:  Cervical exam deferred        Extremities: Normal range of motion.  Edema: None  Mental Status:  Normal mood and affect. Normal behavior. Normal judgment and thought content.   Assessment   29 y.o. [id number] at [redacted]w[redacted]d by  02/08/2020, by Last Menstrual Period presenting for routine prenatal visit  Plan   FOURTH Problems (from 08/18/19 to present)    No problems associated with this episode.       Preterm labor symptoms and general obstetric precautions including but not limited to vaginal bleeding, contractions, leaking of fluid and fetal movement were reviewed in detail with the patient. Please refer to After Visit Summary for other counseling recommendations.   Return in about 2 weeks (around 12/10/2019) for rob.  Tresea Mall, CNM 11/26/2019 9:08 AM

## 2019-11-27 LAB — 28 WEEK RH+PANEL
Basophils Absolute: 0 10*3/uL (ref 0.0–0.2)
Basos: 0 %
EOS (ABSOLUTE): 0.1 10*3/uL (ref 0.0–0.4)
Eos: 1 %
Gestational Diabetes Screen: 132 mg/dL (ref 65–139)
HIV Screen 4th Generation wRfx: NONREACTIVE
Hematocrit: 31.2 % — ABNORMAL LOW (ref 34.0–46.6)
Hemoglobin: 10.3 g/dL — ABNORMAL LOW (ref 11.1–15.9)
Immature Grans (Abs): 0 10*3/uL (ref 0.0–0.1)
Immature Granulocytes: 0 %
Lymphocytes Absolute: 2.2 10*3/uL (ref 0.7–3.1)
Lymphs: 23 %
MCH: 27.6 pg (ref 26.6–33.0)
MCHC: 33 g/dL (ref 31.5–35.7)
MCV: 84 fL (ref 79–97)
Monocytes Absolute: 0.4 10*3/uL (ref 0.1–0.9)
Monocytes: 4 %
Neutrophils Absolute: 6.7 10*3/uL (ref 1.4–7.0)
Neutrophils: 72 %
Platelets: 417 10*3/uL (ref 150–450)
RBC: 3.73 x10E6/uL — ABNORMAL LOW (ref 3.77–5.28)
RDW: 12.7 % (ref 11.7–15.4)
RPR Ser Ql: NONREACTIVE
WBC: 9.5 10*3/uL (ref 3.4–10.8)

## 2019-12-03 ENCOUNTER — Ambulatory Visit (INDEPENDENT_AMBULATORY_CARE_PROVIDER_SITE_OTHER): Payer: Medicaid Other | Admitting: Advanced Practice Midwife

## 2019-12-03 ENCOUNTER — Telehealth: Payer: Self-pay

## 2019-12-03 ENCOUNTER — Other Ambulatory Visit: Payer: Self-pay

## 2019-12-03 ENCOUNTER — Encounter: Payer: Self-pay | Admitting: Advanced Practice Midwife

## 2019-12-03 VITALS — BP 126/74 | Wt 243.0 lb

## 2019-12-03 DIAGNOSIS — Z3A30 30 weeks gestation of pregnancy: Secondary | ICD-10-CM

## 2019-12-03 NOTE — Telephone Encounter (Signed)
Pt calling; is having ctxs q15-20 minutes; what to do?  [phone]  [facility].

## 2019-12-03 NOTE — Progress Notes (Signed)
Pt having a lot of contractions and pressure. No vb. No lof

## 2019-12-03 NOTE — Telephone Encounter (Signed)
Pt states she is at work; they have moved her around trying to make things easier for her; now she has to do a lot of walking.  Adv to go home (nowhere at work to do this), lie on left side, drink a quart of water and monitor ctxs for an hour and call back and let us know how many she's had. Leave msg on nurse line.  Pt states she can't leave until 11am so it will be around 12:30 before she can call back.

## 2019-12-03 NOTE — Progress Notes (Signed)
  Routine Prenatal Care Visit  Subjective  Candice Moore is a 29 y.o. [id number] at [redacted]w[redacted]d being seen today for ongoing prenatal care.  She is currently monitored for the following issues for this high-risk pregnancy and has Hyperemesis affecting pregnancy, antepartum; Hyperemesis complicating pregnancy, antepartum; Hypokalemia; Elevated liver enzymes; Supervision of high risk pregnancy, antepartum; History of shoulder dystocia in prior pregnancy, currently pregnant; BMI 40.0-44.9, adult (HCC); and Obesity in pregnancy on their problem list.  ----------------------------------------------------------------------------------- Patient reports contractions have been every 15-20 minutes since last night. She was unable to work today and took tomorrow off as well. She spoke with HR today and they will be faxing FMLA papers to [place]. She plans to rest the remainder of today and tomorrow and may try to work some next week.  Reassurance given regarding cervical exam today. Precautions reviewed.  Contractions: Irregular. Vag. Bleeding: Scant.  Movement: Present. Leaking Fluid denies.  ----------------------------------------------------------------------------------- The following portions of the patient's history were reviewed and updated as appropriate: allergies, current medications, past family history, past medical history, past social history, past surgical history and problem list. Problem list updated.  Objective  Blood pressure 126/74, weight 243 lb (110.2 kg), last menstrual period 05/04/2019. Pregravid weight 250 lb (113.4 kg) Total Weight Gain -7 lb (-3.175 kg) Urinalysis: Urine Protein    Urine Glucose    Fetal Status: Fetal Heart Rate (bpm): 135   Movement: Present     General:  Alert, oriented and cooperative. Patient is in no acute distress.  Skin: Skin is warm and dry. No rash noted.   Cardiovascular: Normal heart rate noted  Respiratory: Normal respiratory effort, no problems with  respiration noted  Abdomen: Soft, gravid, appropriate for gestational age. Pain/Pressure: Present     Pelvic:  Cervical exam performed Dilation: Fingertip Effacement (%): Thick Station: -3  Extremities: Normal range of motion.     Mental Status: Normal mood and affect. Normal behavior. Normal judgment and thought content.   Assessment   29 y.o. [id number] at [redacted]w[redacted]d by  02/08/2020, by Last Menstrual Period presenting for work-in prenatal visit  Plan   FOURTH Problems (from 08/18/19 to present)    No problems associated with this episode.    Work recommendations: Ok to work as able, out of work as needed for worsening contractions  Activity as tolerated and rest as needed Increase hydration Epsom salt soaks q night  Preterm labor symptoms and general obstetric precautions including but not limited to vaginal bleeding, contractions, leaking of fluid and fetal movement were reviewed in detail with the patient.    Return for has follow up scheduled.  Tresea Mall, CNM 12/03/2019 4:55 PM

## 2019-12-03 NOTE — Telephone Encounter (Signed)
Pt is scheduled with JEG this pm.

## 2019-12-11 ENCOUNTER — Ambulatory Visit (INDEPENDENT_AMBULATORY_CARE_PROVIDER_SITE_OTHER): Payer: BC Managed Care – PPO | Admitting: Certified Nurse Midwife

## 2019-12-11 ENCOUNTER — Other Ambulatory Visit: Payer: Self-pay

## 2019-12-11 VITALS — BP 120/70 | Wt 250.0 lb

## 2019-12-11 DIAGNOSIS — M545 Low back pain, unspecified: Secondary | ICD-10-CM

## 2019-12-11 DIAGNOSIS — Z23 Encounter for immunization: Secondary | ICD-10-CM | POA: Diagnosis not present

## 2019-12-11 DIAGNOSIS — O0990 Supervision of high risk pregnancy, unspecified, unspecified trimester: Secondary | ICD-10-CM

## 2019-12-11 DIAGNOSIS — O26843 Uterine size-date discrepancy, third trimester: Secondary | ICD-10-CM

## 2019-12-11 DIAGNOSIS — G8929 Other chronic pain: Secondary | ICD-10-CM

## 2019-12-11 DIAGNOSIS — O0993 Supervision of high risk pregnancy, unspecified, third trimester: Secondary | ICD-10-CM

## 2019-12-11 DIAGNOSIS — Z6841 Body Mass Index (BMI) 40.0 and over, adult: Secondary | ICD-10-CM

## 2019-12-11 DIAGNOSIS — O99213 Obesity complicating pregnancy, third trimester: Secondary | ICD-10-CM

## 2019-12-11 DIAGNOSIS — Z3A31 31 weeks gestation of pregnancy: Secondary | ICD-10-CM

## 2019-12-11 DIAGNOSIS — O99210 Obesity complicating pregnancy, unspecified trimester: Secondary | ICD-10-CM

## 2019-12-11 LAB — POCT URINALYSIS DIPSTICK OB
Glucose, UA: NEGATIVE
POC,PROTEIN,UA: NEGATIVE

## 2019-12-11 NOTE — Progress Notes (Signed)
C/o lightheadedness and dizzy the last 2-3 days; not sleeping.rj

## 2019-12-11 NOTE — Progress Notes (Signed)
HROB at 31wk4days: Stopped working due to contractions. Contractions at home usually start in the evening, are painful, but have responded to rest. Has 5/hour at times at the most x 1 hour. Complains of pain in sacrococcygeal area with sitting that comes around her hips. Also has had pain in her pubic bone area. Baby active. No bleeding Not sleeping well. Has to get up frequently Discussed delivery options: had a 80 secshoulder dystocia with her last delivery of a 9# baby at 39 weeks. Discussed possible injuries to baby with a shoulder dystocia Offered primary Cesarean section-but patient declined. She thinks this baby will not be as big due to her nausea and lack of weight gain. Gained 60# with her last pregnancy.  Exam: FH 38cm/ FHT 159 with accelerations to 170s with fetal movement Has just reached pre pregnant weight of 250# (gained 7# in last week)  PLan: Growth scan/ ROB in 1 week and again at 36 weeks PT consult for pelvic pain Preterm labor precautions TDAP today BT consent signed  Farrel Conners, CNM

## 2019-12-17 ENCOUNTER — Ambulatory Visit (INDEPENDENT_AMBULATORY_CARE_PROVIDER_SITE_OTHER): Payer: BC Managed Care – PPO | Admitting: Obstetrics & Gynecology

## 2019-12-17 ENCOUNTER — Ambulatory Visit (INDEPENDENT_AMBULATORY_CARE_PROVIDER_SITE_OTHER): Payer: BC Managed Care – PPO

## 2019-12-17 ENCOUNTER — Other Ambulatory Visit: Payer: Self-pay

## 2019-12-17 ENCOUNTER — Encounter: Payer: Self-pay | Admitting: Obstetrics & Gynecology

## 2019-12-17 VITALS — BP 120/80 | Wt 250.0 lb

## 2019-12-17 DIAGNOSIS — O0993 Supervision of high risk pregnancy, unspecified, third trimester: Secondary | ICD-10-CM

## 2019-12-17 DIAGNOSIS — Z3A32 32 weeks gestation of pregnancy: Secondary | ICD-10-CM

## 2019-12-17 DIAGNOSIS — O99213 Obesity complicating pregnancy, third trimester: Secondary | ICD-10-CM

## 2019-12-17 DIAGNOSIS — Z6841 Body Mass Index (BMI) 40.0 and over, adult: Secondary | ICD-10-CM

## 2019-12-17 DIAGNOSIS — Z362 Encounter for other antenatal screening follow-up: Secondary | ICD-10-CM

## 2019-12-17 DIAGNOSIS — O4700 False labor before 37 completed weeks of gestation, unspecified trimester: Secondary | ICD-10-CM

## 2019-12-17 DIAGNOSIS — O26843 Uterine size-date discrepancy, third trimester: Secondary | ICD-10-CM | POA: Diagnosis not present

## 2019-12-17 DIAGNOSIS — O99210 Obesity complicating pregnancy, unspecified trimester: Secondary | ICD-10-CM

## 2019-12-17 DIAGNOSIS — O479 False labor, unspecified: Secondary | ICD-10-CM

## 2019-12-17 NOTE — Progress Notes (Signed)
  Subjective  Fetal Movement? yes Contractions? Yes - in back and radiates to lower pelvis in front, no ROM or VB, has stopped work due to this  Objective  BP 120/80   Wt 250 lb (113.4 kg)   LMP 05/04/2019 (Approximate)   BMI 45.73 kg/m  General: NAD Pumonary: no increased work of breathing Abdomen: gravid, non-tender Extremities: no edema Psychiatric: mood appropriate, affect full  Assessment  29 y.o. [id number] at [redacted]w[redacted]d by  02/08/2020, by Last Menstrual Period presenting for routine prenatal visit  Plan   Problem List Items Addressed This Visit      Other   Supervision of high risk pregnancy, antepartum   Obesity in pregnancy    Other Visit Diagnoses    [redacted] weeks gestation of pregnancy    -  Primary   Preterm contractions       Relevant Orders   Fetal fibronectin    Modified bed rest, consider Procardia or Terb for uterine irritability if worsens If fFN pos, then BMZ and continued surveillance. Counseled her on these possibilities PNV Review of ULTRASOUND.    I have personally reviewed images and report of recent ultrasound done at [facility].    Plan of management to be discussed with patient.    Growth normal (43%) at this time, low concern for LGA    Prior shoulder dystocia discussed , not just based on baby size so precautions still to be made Plans BTL  Annamarie Major, MD, [name], [facility] 12/17/2019  10:34 AM

## 2019-12-17 NOTE — Patient Instructions (Signed)
Third Trimester of Pregnancy The third trimester is from week 28 through week 40 (months 7 through 9). The third trimester is a time when the unborn baby (fetus) is growing rapidly. At the end of the ninth month, the fetus is about 20 inches in length and weighs 6-10 pounds. Body changes during your third trimester Your body will continue to go through many changes during pregnancy. The changes vary from woman to woman. During the third trimester:  Your weight will continue to increase. You can expect to gain 25-35 pounds (11-16 kg) by the end of the pregnancy.  You may begin to get stretch marks on your hips, abdomen, and breasts.  You may urinate more often because the fetus is moving lower into your pelvis and pressing on your bladder.  You may develop or continue to have heartburn. This is caused by increased hormones that slow down muscles in the digestive tract.  You may develop or continue to have constipation because increased hormones slow digestion and cause the muscles that push waste through your intestines to relax.  You may develop hemorrhoids. These are swollen veins (varicose veins) in the rectum that can itch or be painful.  You may develop swollen, bulging veins (varicose veins) in your legs.  You may have increased body aches in the pelvis, back, or thighs. This is due to weight gain and increased hormones that are relaxing your joints.  You may have changes in your hair. These can include thickening of your hair, rapid growth, and changes in texture. Some women also have hair loss during or after pregnancy, or hair that feels dry or thin. Your hair will most likely return to normal after your baby is born.  Your breasts will continue to grow and they will continue to become tender. A yellow fluid (colostrum) may leak from your breasts. This is the first milk you are producing for your baby.  Your belly button may stick out.  You may notice more swelling in your hands,  face, or ankles.  You may have increased tingling or numbness in your hands, arms, and legs. The skin on your belly may also feel numb.  You may feel short of breath because of your expanding uterus.  You may have more problems sleeping. This can be caused by the size of your belly, increased need to urinate, and an increase in your body's metabolism.  You may notice the fetus "dropping," or moving lower in your abdomen (lightening).  You may have increased vaginal discharge.  You may notice your joints feel loose and you may have pain around your pelvic bone. What to expect at prenatal visits You will have prenatal exams every 2 weeks until week 36. Then you will have weekly prenatal exams. During a routine prenatal visit:  You will be weighed to make sure you and the baby are growing normally.  Your blood pressure will be taken.  Your abdomen will be measured to track your baby's growth.  The fetal heartbeat will be listened to.  Any test results from the previous visit will be discussed.  You may have a cervical check near your due date to see if your cervix has softened or thinned (effaced).  You will be tested for Group B streptococcus. This happens between 35 and 37 weeks. Your health care provider may ask you:  What your birth plan is.  How you are feeling.  If you are feeling the baby move.  If you have had any abnormal   symptoms, such as leaking fluid, bleeding, severe headaches, or abdominal cramping.  If you are using any tobacco products, including cigarettes, chewing tobacco, and electronic cigarettes.  If you have any questions. Other tests or screenings that may be performed during your third trimester include:  Blood tests that check for low iron levels (anemia).  Fetal testing to check the health, activity level, and growth of the fetus. Testing is done if you have certain medical conditions or if there are problems during the pregnancy.  Nonstress test  (NST). This test checks the health of your baby to make sure there are no signs of problems, such as the baby not getting enough oxygen. During this test, a belt is placed around your belly. The baby is made to move, and its heart rate is monitored during movement. What is false labor? False labor is a condition in which you feel small, irregular tightenings of the muscles in the womb (contractions) that usually go away with rest, changing position, or drinking water. These are called Braxton Hicks contractions. Contractions may last for hours, days, or even weeks before true labor sets in. If contractions come at regular intervals, become more frequent, increase in intensity, or become painful, you should see your health care provider. What are the signs of labor?  Abdominal cramps.  Regular contractions that start at 10 minutes apart and become stronger and more frequent with time.  Contractions that start on the top of the uterus and spread down to the lower abdomen and back.  Increased pelvic pressure and dull back pain.  A watery or bloody mucus discharge that comes from the vagina.  Leaking of amniotic fluid. This is also known as your "water breaking." It could be a slow trickle or a gush. Let your health care provider know if it has a color or strange odor. If you have any of these signs, call your health care provider right away, even if it is before your due date. Follow these instructions at home: Medicines  Follow your health care provider's instructions regarding medicine use. Specific medicines may be either safe or unsafe to take during pregnancy.  Take a prenatal vitamin that contains at least 600 micrograms (mcg) of folic acid.  If you develop constipation, try taking a stool softener if your health care provider approves. Eating and drinking   Eat a balanced diet that includes fresh fruits and vegetables, whole grains, good sources of protein such as meat, eggs, or tofu,  and low-fat dairy. Your health care provider will help you determine the amount of weight gain that is right for you.  Avoid raw meat and uncooked cheese. These carry germs that can cause birth defects in the baby.  If you have low calcium intake from food, talk to your health care provider about whether you should take a daily calcium supplement.  Eat four or five small meals rather than three large meals a day.  Limit foods that are high in fat and processed sugars, such as fried and sweet foods.  To prevent constipation: ? Drink enough fluid to keep your urine clear or pale yellow. ? Eat foods that are high in fiber, such as fresh fruits and vegetables, whole grains, and beans. Activity  Exercise only as directed by your health care provider. Most women can continue their usual exercise routine during pregnancy. Try to exercise for 30 minutes at least 5 days a week. Stop exercising if you experience uterine contractions.  Avoid heavy lifting.  Do   not exercise in extreme heat or humidity, or at high altitudes.  Wear low-heel, comfortable shoes.  Practice good posture.  You may continue to have sex unless your health care provider tells you otherwise. Relieving pain and discomfort  Take frequent breaks and rest with your legs elevated if you have leg cramps or low back pain.  Take warm sitz baths to soothe any pain or discomfort caused by hemorrhoids. Use hemorrhoid cream if your health care provider approves.  Wear a good support bra to prevent discomfort from breast tenderness.  If you develop varicose veins: ? Wear support pantyhose or compression stockings as told by your healthcare provider. ? Elevate your feet for 15 minutes, 3-4 times a day. Prenatal care  Write down your questions. Take them to your prenatal visits.  Keep all your prenatal visits as told by your health care provider. This is important. Safety  Wear your seat belt at all times when driving.  Make  a list of emergency phone numbers, including numbers for family, friends, the hospital, and police and fire departments. General instructions  Avoid cat litter boxes and soil used by cats. These carry germs that can cause birth defects in the baby. If you have a cat, ask someone to clean the litter box for you.  Do not travel far distances unless it is absolutely necessary and only with the approval of your health care provider.  Do not use hot tubs, steam rooms, or saunas.  Do not drink alcohol.  Do not use any products that contain nicotine or tobacco, such as cigarettes and e-cigarettes. If you need help quitting, ask your health care provider.  Do not use any medicinal herbs or unprescribed drugs. These chemicals affect the formation and growth of the baby.  Do not douche or use tampons or scented sanitary pads.  Do not cross your legs for long periods of time.  To prepare for the arrival of your baby: ? Take prenatal classes to understand, practice, and ask questions about labor and delivery. ? Make a trial run to the hospital. ? Visit the hospital and tour the maternity area. ? Arrange for maternity or paternity leave through employers. ? Arrange for family and friends to take care of pets while you are in the hospital. ? Purchase a rear-facing car seat and make sure you know how to install it in your car. ? Pack your hospital bag. ? Prepare the baby's nursery. Make sure to remove all pillows and stuffed animals from the baby's crib to prevent suffocation.  Visit your dentist if you have not gone during your pregnancy. Use a soft toothbrush to brush your teeth and be gentle when you floss. Contact a health care provider if:  You are unsure if you are in labor or if your water has broken.  You become dizzy.  You have mild pelvic cramps, pelvic pressure, or nagging pain in your abdominal area.  You have lower back pain.  You have persistent nausea, vomiting, or  diarrhea.  You have an unusual or bad smelling vaginal discharge.  You have pain when you urinate. Get help right away if:  Your water breaks before 37 weeks.  You have regular contractions less than 5 minutes apart before 37 weeks.  You have a fever.  You are leaking fluid from your vagina.  You have spotting or bleeding from your vagina.  You have severe abdominal pain or cramping.  You have rapid weight loss or weight gain.  You have   shortness of breath with chest pain.  You notice sudden or extreme swelling of your face, hands, ankles, feet, or legs.  Your baby makes fewer than 10 movements in 2 hours.  You have severe headaches that do not go away when you take medicine.  You have vision changes. Summary  The third trimester is from week 28 through week 40, months 7 through 9. The third trimester is a time when the unborn baby (fetus) is growing rapidly.  During the third trimester, your discomfort may increase as you and your baby continue to gain weight. You may have abdominal, leg, and back pain, sleeping problems, and an increased need to urinate.  During the third trimester your breasts will keep growing and they will continue to become tender. A yellow fluid (colostrum) may leak from your breasts. This is the first milk you are producing for your baby.  False labor is a condition in which you feel small, irregular tightenings of the muscles in the womb (contractions) that eventually go away. These are called Braxton Hicks contractions. Contractions may last for hours, days, or even weeks before true labor sets in.  Signs of labor can include: abdominal cramps; regular contractions that start at 10 minutes apart and become stronger and more frequent with time; watery or bloody mucus discharge that comes from the vagina; increased pelvic pressure and dull back pain; and leaking of amniotic fluid. This information is not intended to replace advice given to you by your  health care provider. Make sure you discuss any questions you have with your health care provider. Document Revised: 01/01/2019 Document Reviewed: 10/16/2016 Elsevier Patient Education  2020 [employer]  

## 2019-12-18 ENCOUNTER — Telehealth: Payer: Self-pay | Admitting: Obstetrics & Gynecology

## 2019-12-18 LAB — FETAL FIBRONECTIN: Fetal Fibronectin: NEGATIVE

## 2019-12-18 NOTE — Telephone Encounter (Signed)
Left voicemail for patient to call back to confirm schedule appointment.

## 2019-12-18 NOTE — Telephone Encounter (Signed)
-----   Message from Nadara Mustard, MD sent at 12/18/2019  9:42 AM EDT ----- Regarding: Pt needs HROB appt next Thurs plz, any OB provider

## 2019-12-23 ENCOUNTER — Other Ambulatory Visit: Payer: Self-pay

## 2019-12-23 ENCOUNTER — Encounter: Payer: Self-pay | Admitting: Physical Therapy

## 2019-12-23 ENCOUNTER — Ambulatory Visit: Payer: BC Managed Care – PPO | Attending: Certified Nurse Midwife | Admitting: Physical Therapy

## 2019-12-23 ENCOUNTER — Other Ambulatory Visit: Payer: Self-pay | Admitting: Certified Nurse Midwife

## 2019-12-23 DIAGNOSIS — Z6841 Body Mass Index (BMI) 40.0 and over, adult: Secondary | ICD-10-CM

## 2019-12-23 DIAGNOSIS — M6281 Muscle weakness (generalized): Secondary | ICD-10-CM | POA: Insufficient documentation

## 2019-12-23 DIAGNOSIS — M533 Sacrococcygeal disorders, not elsewhere classified: Secondary | ICD-10-CM

## 2019-12-23 DIAGNOSIS — O479 False labor, unspecified: Secondary | ICD-10-CM | POA: Insufficient documentation

## 2019-12-23 DIAGNOSIS — O4700 False labor before 37 completed weeks of gestation, unspecified trimester: Secondary | ICD-10-CM | POA: Insufficient documentation

## 2019-12-23 NOTE — Patient Instructions (Signed)
  Clam Shell 45 Degrees   Lying with hips and knees bent 45, one pillow between knees and ankles. Lift knee with exhale. Be sure pelvis does not roll backward. Do not arch back. Do 30 times, each leg, 2 times per day.      Complimentary stretch: [employer] _ foot over _ thigh, opposite knee straight  3 breaths  * Keep pelvis levelled with tactile cue with hand under back of hips  * Slide the ankle of the supporting foot out to decrease the angle which can help level the pelvis   ___   Minisquat: Scoot buttocks back slight, hinge like you are looking at your reflection on a pond  Knees behind toes,  Inhale to "smell flowers"  Exhale on the rise "like rocket"  Do not lock knees, have more weight across ballmounds of feet, toes relaxed   10 reps x 3 x day   ____  Wear SIJ belt for more support   ____   Proper body mechanics with getting out of a chair to decrease strain  on back &pelvic floor   Avoid holding your breath when Getting out of the chair:  Scoot to front part of chair chair Heels behind feet, feet are hip width apart, nose over toes  Inhale like you are smelling roses Exhale to stand

## 2019-12-23 NOTE — Therapy (Signed)
[place] [facility] MAIN [facility] SERVICES [address] [place], [place], [address] Phone: [phone]   Fax:  [phone]  Physical Therapy Evaluation  Patient Details  Name: Candice Moore MRN: [id number] Date of Birth: 05-Oct-1990 Referring Provider (PT): Berle Mull Date: 12/23/2019  PT End of Session - 12/23/19 1213    Visit Number  1    Number of Visits  4    Date for PT Re-Evaluation  01/20/20    Authorization Type  MCaid    PT Start Time  1207    PT Stop Time  1305    PT Time Calculation (min)  58 min       Past Medical History:  Diagnosis Date  . Anxiety   . Asthma   . GERD (gastroesophageal reflux disease)   . Obesity affecting pregnancy     Past Surgical History:  Procedure Laterality Date  . CHOLECYSTECTOMY      There were no vitals filed for this visit.   Subjective Assessment - 12/23/19 1216    Subjective  Pt is [redacted] weeks pregnant with her 3rd child. Pt noticed LBP at the middle of her spine starting 6 months of her pregnancy. Pt had bearable  back pain with past pregnancies and was still able to work. This back pain also occurs with B lateral non-radiating  hip pain. Sidelying hurts. Sometimes she can not walk and she has to lay down. Pain level 8-9/10. Pt is not working right now.   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 The back and hip pain limited her activities: cleaning, cooking, walking, driving. Pt has been on bed rest for the past 3 weeks. Denied constipation, urinary issues. Pt is worried about how much it will hurt during birth.    Pertinent History  cholestomy, Gyn Hx: past 2 deliveries were vaginal without tears.    Patient Stated Goals  help with hip pain         [facility] PT Assessment - 12/23/19 1227      Assessment   Medical Diagnosis  low back pain      Referring Provider (PT)  Sharen Hones       Precautions   Precautions  None       Restrictions   Weight Bearing Restrictions  No      Balance Screen   Has the patient fallen in the past 6 months  No      Observation/Other Assessments   Observations  hyperextended knees       Strength   Overall Strength Comments   BLE 4/5  , hip abduction 3/5 B        Palpation   Spinal mobility  R rotation w/ R pain, L sideflexion less than R  w/ R pain ( post Tx: no pain with side flexion L, R rotation)       SI assessment   L iiiliac crest/ patella higher than R       Bed Mobility   Bed Mobility  --   no pain                Objective measurements completed on examination: See above findings.      [facility] Adult PT Treatment/Exercise - 12/23/19 1227      Neuro Re-ed    Neuro Re-ed Details   cued for hip abduction strengthening, cued for equal weight bearing on BLE  Manual Therapy   Manual therapy comments  long axis distraction BLE, rotational mob to promote SIJ mobility a. Inferior glide at L iliac crest                PT Short Term Goals - 12/23/19 1500      PT SHORT TERM GOAL #1   Title  Pt will demo equal alignment of pelvic girdle across 2 visits in order to resolve pain with sidelying to sleep    Baseline  L iliac crest higher    Time  2    Period  Weeks    Status  New    Target Date  01/06/20      PT SHORT TERM GOAL #2   Title  Pt will demo increased hip abduction strength to 4+/5 B in order to have strength for walking with children for exercise    Baseline  3/5 B    Time  3    Period  Weeks    Status  New    Target Date  01/13/20      PT SHORT TERM GOAL #3   Title  Pt will report being able to sleep on her B hips in sidelying  through one night without pain interruptions in order to improve sleep quality    Baseline  not able to sleep through the night due to pain interruptions    Time  4    Period  Weeks    Status  New    Target Date  01/20/20                Plan - 12/23/19 1458    Clinical Impression Statement    Pt is a 29 yo who is [redacted] weeks pregnant with her 3rd child and experiences non-radiating LBP with B hip pain. This deficit impacts her ability to sleep on her side, perform ADLs, care for her children.   Pt's musculoskeletal assessment revealed higher L iliac crest, weak hip weakness, increased lumbar lordosis, and pain with R spinal rotation/ L sideflexion. These are deficits that indicate an ineffective intraabdominal pressure system associated with pt's Sx.   Following Tx today which pt tolerated without complaints, pt demo'd equal alignment of pelvic girdle and increased spinal mobility without pain. Pt was able to rest for 5 min in her L hip in sidelying without pain which will help pt improve her sleep quality. Provided SIJ belt to pt which pt can don/dof IND after training. Plan to progress to deep core coordination/ strengthening and body mechanics training with household activities at next session.      Examination-Activity Limitations  Sit;Sleep    Stability/Clinical Decision Making  Evolving/Moderate complexity    Clinical Decision Making  Moderate    Rehab Potential  Good    PT Frequency  1x / week    PT Duration  4 weeks    PT Treatment/Interventions  Therapeutic activities;Neuromuscular re-education;Patient/family education;Therapeutic exercise;Moist Heat;Manual techniques;Functional mobility training;Balance training;Gait training;Stair training    Consulted and Agree with Plan of Care  Patient       Patient will benefit from skilled therapeutic intervention in order to improve the following deficits and impairments:  Difficulty walking, Decreased endurance, Impaired perceived functional ability, Hypomobility, Improper body mechanics, Decreased strength, Decreased coordination, Decreased activity tolerance, Decreased safety awareness, Decreased knowledge of precautions, Decreased range of motion, Hypermobility, Postural dysfunction, Pain, Increased muscle spasms, Decreased  mobility  Visit Diagnosis: Sacrococcygeal disorders, not elsewhere classified  Muscle weakness (generalized)  Problem List Patient Active Problem List   Diagnosis Date Noted  . Supervision of high risk pregnancy, antepartum 08/19/2019  . History of shoulder dystocia in prior pregnancy, currently pregnant 08/19/2019  . Adult BMI 45.0-49.9 kg/sq m ([place]) 08/19/2019  . Obesity in pregnancy 08/19/2019  . Hypokalemia 08/08/2019  . Elevated liver enzymes 08/08/2019  . Hyperemesis complicating pregnancy, antepartum 08/07/2019  . Hyperemesis affecting pregnancy, antepartum 08/06/2019    Jerl Mina ,PT, DPT, E-RYT  12/23/2019, 3:14 PM  [place] MAIN [facility] SERVICES [place], [place], [address] Phone: [phone]   Fax:  [phone]  Name: Candice Moore MRN: [id number] Date of Birth: 08-05-1991

## 2019-12-24 ENCOUNTER — Encounter: Payer: Medicaid Other | Admitting: Certified Nurse Midwife

## 2019-12-29 ENCOUNTER — Encounter: Payer: Self-pay | Admitting: Obstetrics and Gynecology

## 2019-12-29 ENCOUNTER — Other Ambulatory Visit: Payer: Self-pay

## 2019-12-29 ENCOUNTER — Ambulatory Visit (INDEPENDENT_AMBULATORY_CARE_PROVIDER_SITE_OTHER): Payer: Medicaid Other | Admitting: Obstetrics and Gynecology

## 2019-12-29 ENCOUNTER — Encounter: Payer: Medicaid Other | Admitting: Physical Therapy

## 2019-12-29 VITALS — BP 120/60 | Wt 255.0 lb

## 2019-12-29 DIAGNOSIS — O99210 Obesity complicating pregnancy, unspecified trimester: Secondary | ICD-10-CM

## 2019-12-29 DIAGNOSIS — O09299 Supervision of pregnancy with other poor reproductive or obstetric history, unspecified trimester: Secondary | ICD-10-CM

## 2019-12-29 DIAGNOSIS — O09293 Supervision of pregnancy with other poor reproductive or obstetric history, third trimester: Secondary | ICD-10-CM

## 2019-12-29 DIAGNOSIS — O0993 Supervision of high risk pregnancy, unspecified, third trimester: Secondary | ICD-10-CM

## 2019-12-29 DIAGNOSIS — Z3A34 34 weeks gestation of pregnancy: Secondary | ICD-10-CM

## 2019-12-29 DIAGNOSIS — O0990 Supervision of high risk pregnancy, unspecified, unspecified trimester: Secondary | ICD-10-CM

## 2019-12-29 DIAGNOSIS — O99213 Obesity complicating pregnancy, third trimester: Secondary | ICD-10-CM

## 2019-12-29 NOTE — Progress Notes (Signed)
    Routine Prenatal Care Visit  Subjective  Candice Moore is a 29 y.o. [id number] at [redacted]w[redacted]d being seen today for ongoing prenatal care.  She is currently monitored for the following issues for this high-risk pregnancy and has Hyperemesis affecting pregnancy, antepartum; Hyperemesis complicating pregnancy, antepartum; Hypokalemia; Elevated liver enzymes; Supervision of high risk pregnancy, antepartum; History of shoulder dystocia in prior pregnancy, currently pregnant; Adult BMI 45.0-49.9 kg/sq m (HCC); Obesity in pregnancy; Sacrococcygeal disorders, not elsewhere classified; and Preterm contractions on their problem list.  ----------------------------------------------------------------------------------- Patient reports no complaints.   Contractions: Irregular. Vag. Bleeding: None.  Movement: Present. Denies leaking of fluid.  ----------------------------------------------------------------------------------- The following portions of the patient's history were reviewed and updated as appropriate: allergies, current medications, past family history, past medical history, past social history, past surgical history and problem list. Problem list updated.   Objective  Blood pressure 120/60, weight 255 lb (115.7 kg), last menstrual period 05/04/2019. Pregravid weight 250 lb (113.4 kg) Total Weight Gain 5 lb (2.268 kg) Urinalysis:      Fetal Status: Fetal Heart Rate (bpm): 140 Fundal Height: 42 cm Movement: Present     General:  Alert, oriented and cooperative. Patient is in no acute distress.  Skin: Skin is warm and dry. No rash noted.   Cardiovascular: Normal heart rate noted  Respiratory: Normal respiratory effort, no problems with respiration noted  Abdomen: Soft, gravid, appropriate for gestational age. Pain/Pressure: Present     Pelvic:  Cervical exam deferred        Extremities: Normal range of motion.  Edema: None  Mental Status: Normal mood and affect. Normal behavior. Normal judgment  and thought content.     Assessment   29 y.o. [id number] at [redacted]w[redacted]d by  02/08/2020, by Last Menstrual Period presenting for routine prenatal visit  Plan   FOURTH Problems (from 08/18/19 to present)    Problem Noted Resolved   Supervision of high risk pregnancy, antepartum 08/19/2019 by Farrel Conners, CNM No   Overview Addendum 12/29/2019  9:45 AM by Natale Milch, MD    Clinic [facility] Prenatal Labs  Dating LMP=7wk Blood type:   O POS  Genetic Screen  Antibody: negative  Anatomic [place] Normal anatomy, female gender, anterior placenta Rubella:   Immune Varicella: Non immune  GTT Early: 130              Third trimester: 132 RPR:   non reactive  Rhogam n/a HBsAg: NON REACTIVE (11/13 1629)   TDaP vaccine      3/19      Flu Shot:decl HIV:   non reactive  Baby Food          Breast GBS:   Contraception          BTL Pap: NIL 2020  CBB  No   CS/VBAC Declines CS for h/o shoulder dystocia.  Prior NSVD   Support Person               Gestational age appropriate obstetric precautions including but not limited to vaginal bleeding, contractions, leaking of fluid and fetal movement were reviewed in detail with the patient.    Return in about 1 week (around 01/05/2020) for ROB in person.  Natale Milch MD [facility], [facility] 12/29/2019, 9:51 AM

## 2019-12-29 NOTE — Progress Notes (Signed)
ROB C/o contractions has gotten better not working Denies lof, no vb, Good FM

## 2020-01-06 ENCOUNTER — Other Ambulatory Visit (HOSPITAL_COMMUNITY)
Admission: RE | Admit: 2020-01-06 | Discharge: 2020-01-06 | Disposition: A | Discharge status: Home / Self Care | Payer: BLUE CROSS/BLUE SHIELD | Source: Ambulatory Visit | Attending: Obstetrics and Gynecology | Admitting: Obstetrics and Gynecology

## 2020-01-06 ENCOUNTER — Ambulatory Visit (INDEPENDENT_AMBULATORY_CARE_PROVIDER_SITE_OTHER): Payer: Medicaid Other | Admitting: Obstetrics and Gynecology

## 2020-01-06 ENCOUNTER — Other Ambulatory Visit: Payer: Self-pay

## 2020-01-06 ENCOUNTER — Encounter: Payer: Self-pay | Admitting: Obstetrics and Gynecology

## 2020-01-06 VITALS — BP 116/78 | Wt 259.0 lb

## 2020-01-06 DIAGNOSIS — Z3A35 35 weeks gestation of pregnancy: Secondary | ICD-10-CM

## 2020-01-06 DIAGNOSIS — O99213 Obesity complicating pregnancy, third trimester: Secondary | ICD-10-CM

## 2020-01-06 DIAGNOSIS — O0990 Supervision of high risk pregnancy, unspecified, unspecified trimester: Secondary | ICD-10-CM | POA: Insufficient documentation

## 2020-01-06 DIAGNOSIS — O09299 Supervision of pregnancy with other poor reproductive or obstetric history, unspecified trimester: Secondary | ICD-10-CM

## 2020-01-06 DIAGNOSIS — O09293 Supervision of pregnancy with other poor reproductive or obstetric history, third trimester: Secondary | ICD-10-CM

## 2020-01-06 DIAGNOSIS — O99210 Obesity complicating pregnancy, unspecified trimester: Secondary | ICD-10-CM

## 2020-01-06 DIAGNOSIS — O0993 Supervision of high risk pregnancy, unspecified, third trimester: Secondary | ICD-10-CM

## 2020-01-06 LAB — POCT URINALYSIS DIPSTICK OB: Glucose, UA: NEGATIVE

## 2020-01-06 NOTE — Patient Instructions (Signed)
Third Trimester of Pregnancy The third trimester is from week 28 through week 40 (months 7 through 9). The third trimester is a time when the unborn baby (fetus) is growing rapidly. At the end of the ninth month, the fetus is about 20 inches in length and weighs 6-10 pounds. Body changes during your third trimester Your body will continue to go through many changes during pregnancy. The changes vary from woman to woman. During the third trimester:  Your weight will continue to increase. You can expect to gain 25-35 pounds (11-16 kg) by the end of the pregnancy.  You may begin to get stretch marks on your hips, abdomen, and breasts.  You may urinate more often because the fetus is moving lower into your pelvis and pressing on your bladder.  You may develop or continue to have heartburn. This is caused by increased hormones that slow down muscles in the digestive tract.  You may develop or continue to have constipation because increased hormones slow digestion and cause the muscles that push waste through your intestines to relax.  You may develop hemorrhoids. These are swollen veins (varicose veins) in the rectum that can itch or be painful.  You may develop swollen, bulging veins (varicose veins) in your legs.  You may have increased body aches in the pelvis, back, or thighs. This is due to weight gain and increased hormones that are relaxing your joints.  You may have changes in your hair. These can include thickening of your hair, rapid growth, and changes in texture. Some women also have hair loss during or after pregnancy, or hair that feels dry or thin. Your hair will most likely return to normal after your baby is born.  Your breasts will continue to grow and they will continue to become tender. A yellow fluid (colostrum) may leak from your breasts. This is the first milk you are producing for your baby.  Your belly button may stick out.  You may notice more swelling in your hands,  face, or ankles.  You may have increased tingling or numbness in your hands, arms, and legs. The skin on your belly may also feel numb.  You may feel short of breath because of your expanding uterus.  You may have more problems sleeping. This can be caused by the size of your belly, increased need to urinate, and an increase in your body's metabolism.  You may notice the fetus "dropping," or moving lower in your abdomen (lightening).  You may have increased vaginal discharge.  You may notice your joints feel loose and you may have pain around your pelvic bone. What to expect at prenatal visits You will have prenatal exams every 2 weeks until week 36. Then you will have weekly prenatal exams. During a routine prenatal visit:  You will be weighed to make sure you and the baby are growing normally.  Your blood pressure will be taken.  Your abdomen will be measured to track your baby's growth.  The fetal heartbeat will be listened to.  Any test results from the previous visit will be discussed.  You may have a cervical check near your due date to see if your cervix has softened or thinned (effaced).  You will be tested for Group B streptococcus. This happens between 35 and 37 weeks. Your health care provider may ask you:  What your birth plan is.  How you are feeling.  If you are feeling the baby move.  If you have had any abnormal   symptoms, such as leaking fluid, bleeding, severe headaches, or abdominal cramping.  If you are using any tobacco products, including cigarettes, chewing tobacco, and electronic cigarettes.  If you have any questions. Other tests or screenings that may be performed during your third trimester include:  Blood tests that check for low iron levels (anemia).  Fetal testing to check the health, activity level, and growth of the fetus. Testing is done if you have certain medical conditions or if there are problems during the pregnancy.  Nonstress test  (NST). This test checks the health of your baby to make sure there are no signs of problems, such as the baby not getting enough oxygen. During this test, a belt is placed around your belly. The baby is made to move, and its heart rate is monitored during movement. What is false labor? False labor is a condition in which you feel small, irregular tightenings of the muscles in the womb (contractions) that usually go away with rest, changing position, or drinking water. These are called Braxton Hicks contractions. Contractions may last for hours, days, or even weeks before true labor sets in. If contractions come at regular intervals, become more frequent, increase in intensity, or become painful, you should see your health care provider. What are the signs of labor?  Abdominal cramps.  Regular contractions that start at 10 minutes apart and become stronger and more frequent with time.  Contractions that start on the top of the uterus and spread down to the lower abdomen and back.  Increased pelvic pressure and dull back pain.  A watery or bloody mucus discharge that comes from the vagina.  Leaking of amniotic fluid. This is also known as your "water breaking." It could be a slow trickle or a gush. Let your health care provider know if it has a color or strange odor. If you have any of these signs, call your health care provider right away, even if it is before your due date. Follow these instructions at home: Medicines  Follow your health care provider's instructions regarding medicine use. Specific medicines may be either safe or unsafe to take during pregnancy.  Take a prenatal vitamin that contains at least 600 micrograms (mcg) of folic acid.  If you develop constipation, try taking a stool softener if your health care provider approves. Eating and drinking   Eat a balanced diet that includes fresh fruits and vegetables, whole grains, good sources of protein such as meat, eggs, or tofu,  and low-fat dairy. Your health care provider will help you determine the amount of weight gain that is right for you.  Avoid raw meat and uncooked cheese. These carry germs that can cause birth defects in the baby.  If you have low calcium intake from food, talk to your health care provider about whether you should take a daily calcium supplement.  Eat four or five small meals rather than three large meals a day.  Limit foods that are high in fat and processed sugars, such as fried and sweet foods.  To prevent constipation: ? Drink enough fluid to keep your urine clear or pale yellow. ? Eat foods that are high in fiber, such as fresh fruits and vegetables, whole grains, and beans. Activity  Exercise only as directed by your health care provider. Most women can continue their usual exercise routine during pregnancy. Try to exercise for 30 minutes at least 5 days a week. Stop exercising if you experience uterine contractions.  Avoid heavy lifting.  Do   not exercise in extreme heat or humidity, or at high altitudes.  Wear low-heel, comfortable shoes.  Practice good posture.  You may continue to have sex unless your health care provider tells you otherwise. Relieving pain and discomfort  Take frequent breaks and rest with your legs elevated if you have leg cramps or low back pain.  Take warm sitz baths to soothe any pain or discomfort caused by hemorrhoids. Use hemorrhoid cream if your health care provider approves.  Wear a good support bra to prevent discomfort from breast tenderness.  If you develop varicose veins: ? Wear support pantyhose or compression stockings as told by your healthcare provider. ? Elevate your feet for 15 minutes, 3-4 times a day. Prenatal care  Write down your questions. Take them to your prenatal visits.  Keep all your prenatal visits as told by your health care provider. This is important. Safety  Wear your seat belt at all times when driving.  Make  a list of emergency phone numbers, including numbers for family, friends, the hospital, and police and fire departments. General instructions  Avoid cat litter boxes and soil used by cats. These carry germs that can cause birth defects in the baby. If you have a cat, ask someone to clean the litter box for you.  Do not travel far distances unless it is absolutely necessary and only with the approval of your health care provider.  Do not use hot tubs, steam rooms, or saunas.  Do not drink alcohol.  Do not use any products that contain nicotine or tobacco, such as cigarettes and e-cigarettes. If you need help quitting, ask your health care provider.  Do not use any medicinal herbs or unprescribed drugs. These chemicals affect the formation and growth of the baby.  Do not douche or use tampons or scented sanitary pads.  Do not cross your legs for long periods of time.  To prepare for the arrival of your baby: ? Take prenatal classes to understand, practice, and ask questions about labor and delivery. ? Make a trial run to the hospital. ? Visit the hospital and tour the maternity area. ? Arrange for maternity or paternity leave through employers. ? Arrange for family and friends to take care of pets while you are in the hospital. ? Purchase a rear-facing car seat and make sure you know how to install it in your car. ? Pack your hospital bag. ? Prepare the baby's nursery. Make sure to remove all pillows and stuffed animals from the baby's crib to prevent suffocation.  Visit your dentist if you have not gone during your pregnancy. Use a soft toothbrush to brush your teeth and be gentle when you floss. Contact a health care provider if:  You are unsure if you are in labor or if your water has broken.  You become dizzy.  You have mild pelvic cramps, pelvic pressure, or nagging pain in your abdominal area.  You have lower back pain.  You have persistent nausea, vomiting, or  diarrhea.  You have an unusual or bad smelling vaginal discharge.  You have pain when you urinate. Get help right away if:  Your water breaks before 37 weeks.  You have regular contractions less than 5 minutes apart before 37 weeks.  You have a fever.  You are leaking fluid from your vagina.  You have spotting or bleeding from your vagina.  You have severe abdominal pain or cramping.  You have rapid weight loss or weight gain.  You have   shortness of breath with chest pain.  You notice sudden or extreme swelling of your face, hands, ankles, feet, or legs.  Your baby makes fewer than 10 movements in 2 hours.  You have severe headaches that do not go away when you take medicine.  You have vision changes. Summary  The third trimester is from week 28 through week 40, months 7 through 9. The third trimester is a time when the unborn baby (fetus) is growing rapidly.  During the third trimester, your discomfort may increase as you and your baby continue to gain weight. You may have abdominal, leg, and back pain, sleeping problems, and an increased need to urinate.  During the third trimester your breasts will keep growing and they will continue to become tender. A yellow fluid (colostrum) may leak from your breasts. This is the first milk you are producing for your baby.  False labor is a condition in which you feel small, irregular tightenings of the muscles in the womb (contractions) that eventually go away. These are called Braxton Hicks contractions. Contractions may last for hours, days, or even weeks before true labor sets in.  Signs of labor can include: abdominal cramps; regular contractions that start at 10 minutes apart and become stronger and more frequent with time; watery or bloody mucus discharge that comes from the vagina; increased pelvic pressure and dull back pain; and leaking of amniotic fluid. This information is not intended to replace advice given to you by your  health care provider. Make sure you discuss any questions you have with your health care provider. Document Revised: 01/01/2019 Document Reviewed: 10/16/2016 Elsevier Patient Education  2020 [employer]  

## 2020-01-06 NOTE — Addendum Note (Signed)
Addended by: Adelene Idler on: 01/06/2020 10:58 AM   Modules accepted: Orders

## 2020-01-06 NOTE — Progress Notes (Signed)
    Routine Prenatal Care Visit  Subjective  Candice Moore is a 28 y.o. [phone] at [redacted]w[redacted]d being seen today for ongoing prenatal care.  She is currently monitored for the following issues for this high-risk pregnancy and has Hyperemesis affecting pregnancy, antepartum; Hyperemesis complicating pregnancy, antepartum; Hypokalemia; Elevated liver enzymes; Supervision of high risk pregnancy, antepartum; History of shoulder dystocia in prior pregnancy, currently pregnant; Adult BMI 45.0-49.9 kg/sq m (HCC); Obesity in pregnancy; Sacrococcygeal disorders, not elsewhere classified; and Preterm contractions on their problem list.  ----------------------------------------------------------------------------------- Patient reports occassional contractions, 3 in a row then they resolve.   Contractions: Irregular. Vag. Bleeding: None.  Movement: Present. Denies leaking of fluid.  ----------------------------------------------------------------------------------- The following portions of the patient's history were reviewed and updated as appropriate: allergies, current medications, past family history, past medical history, past social history, past surgical history and problem list. Problem list updated.   Objective  Blood pressure 116/78, weight 259 lb (117.5 kg), last menstrual period 05/04/2019. Pregravid weight 250 lb (113.4 kg) Total Weight Gain 9 lb (4.082 kg) Urinalysis:      Fetal Status: Fetal Heart Rate (bpm): 125 Fundal Height: 39 cm Movement: Present     General:  Alert, oriented and cooperative. Patient is in no acute distress.  Skin: Skin is warm and dry. No rash noted.   Cardiovascular: Normal heart rate noted  Respiratory: Normal respiratory effort, no problems with respiration noted  Abdomen: Soft, gravid, appropriate for gestational age. Pain/Pressure: Present     Pelvic:  Cervical exam performed Dilation: 2 Effacement (%): 50 Station: -3  Extremities: Normal range of motion.  Edema:  None  Mental Status: Normal mood and affect. Normal behavior. Normal judgment and thought content.     Assessment   29 y.o. [id number] at [redacted]w[redacted]d by  02/08/2020, by Last Menstrual Period presenting for routine prenatal visit  Plan   FOURTH Problems (from 08/18/19 to present)    Problem Noted Resolved   Supervision of high risk pregnancy, antepartum 08/19/2019 by Farrel Conners, CNM No   Overview Addendum 12/29/2019  9:45 AM by Natale Milch, MD    Clinic [facility] Prenatal Labs  Dating LMP=7wk Blood type:   O POS  Genetic Screen  Antibody: negative  Anatomic [place] Normal anatomy, female gender, anterior placenta Rubella:   Immune Varicella: Non immune  GTT Early: 130              Third trimester: 132 RPR:   non reactive  Rhogam n/a HBsAg: NON REACTIVE (11/13 1629)   TDaP vaccine      3/19      Flu Shot:decl HIV:   non reactive  Baby Food          Breast GBS:   Contraception          BTL Pap: NIL 2020  CBB  No   CS/VBAC Declines CS for h/o shoulder dystocia.  Prior NSVD   Support Person              Growth [place] next visit Discussed reasons to present to L&D if contractions worsen.  Gestational age appropriate obstetric precautions including but not limited to vaginal bleeding, contractions, leaking of fluid and fetal movement were reviewed in detail with the patient.    Return in about 1 week (around 01/13/2020) for ROB and [place] (growth/afi).  Natale Milch MD [facility], [facility] 01/06/2020, 10:28 AM

## 2020-01-07 LAB — CERVICOVAGINAL ANCILLARY ONLY
Chlamydia: NEGATIVE
Comment: NEGATIVE
Comment: NORMAL
Neisseria Gonorrhea: NEGATIVE

## 2020-01-10 LAB — CULTURE, BETA STREP (GROUP B ONLY): Strep Gp B Culture: NEGATIVE

## 2020-01-12 ENCOUNTER — Ambulatory Visit: Payer: BC Managed Care – PPO | Admitting: Physical Therapy

## 2020-01-13 ENCOUNTER — Ambulatory Visit (INDEPENDENT_AMBULATORY_CARE_PROVIDER_SITE_OTHER): Payer: BC Managed Care – PPO

## 2020-01-13 ENCOUNTER — Ambulatory Visit (INDEPENDENT_AMBULATORY_CARE_PROVIDER_SITE_OTHER): Payer: Medicaid Other | Admitting: Obstetrics and Gynecology

## 2020-01-13 ENCOUNTER — Other Ambulatory Visit: Payer: Self-pay

## 2020-01-13 VITALS — BP 110/80 | Wt 259.0 lb

## 2020-01-13 DIAGNOSIS — O0993 Supervision of high risk pregnancy, unspecified, third trimester: Secondary | ICD-10-CM | POA: Diagnosis not present

## 2020-01-13 DIAGNOSIS — Z3A36 36 weeks gestation of pregnancy: Secondary | ICD-10-CM

## 2020-01-13 DIAGNOSIS — O99213 Obesity complicating pregnancy, third trimester: Secondary | ICD-10-CM

## 2020-01-13 DIAGNOSIS — O99210 Obesity complicating pregnancy, unspecified trimester: Secondary | ICD-10-CM

## 2020-01-13 DIAGNOSIS — O0990 Supervision of high risk pregnancy, unspecified, unspecified trimester: Secondary | ICD-10-CM

## 2020-01-13 DIAGNOSIS — Z3A35 35 weeks gestation of pregnancy: Secondary | ICD-10-CM | POA: Diagnosis not present

## 2020-01-13 LAB — POCT URINALYSIS DIPSTICK OB: Glucose, UA: NEGATIVE

## 2020-01-13 NOTE — Progress Notes (Signed)
Routine Prenatal Care Visit  Subjective  Candice Moore is a 29 y.o. [phone] at [redacted]w[redacted]d being seen today for ongoing prenatal care.  She is currently monitored for the following issues for this high-risk pregnancy and has Hyperemesis affecting pregnancy, antepartum; Hyperemesis complicating pregnancy, antepartum; Hypokalemia; Elevated liver enzymes; Supervision of high risk pregnancy, antepartum; History of shoulder dystocia in prior pregnancy, currently pregnant; Adult BMI 45.0-49.9 kg/sq m ([place]); Obesity in pregnancy; Sacrococcygeal disorders, not elsewhere classified; and Preterm contractions on their problem list.  ----------------------------------------------------------------------------------- Patient reports no complaints.   Contractions: Irregular. Vag. Bleeding: None.  Movement: Present. Denies leaking of fluid.  ----------------------------------------------------------------------------------- The following portions of the patient's history were reviewed and updated as appropriate: allergies, current medications, past family history, past medical history, past social history, past surgical history and problem list. Problem list updated.   Objective  Blood pressure 110/80, weight 259 lb (117.5 kg), last menstrual period 05/04/2019. Pregravid weight 250 lb (113.4 kg) Total Weight Gain 9 lb (4.082 kg)  Body mass index is 47.37 kg/m.  Urinalysis:      Fetal Status: Fetal Heart Rate (bpm): 145 Fundal Height: 40 cm Movement: Present  Presentation: Vertex  General:  Alert, oriented and cooperative. Patient is in no acute distress.  Skin: Skin is warm and dry. No rash noted.   Cardiovascular: Normal heart rate noted  Respiratory: Normal respiratory effort, no problems with respiration noted  Abdomen: Soft, gravid, appropriate for gestational age. Pain/Pressure: Present     Pelvic:  Cervical exam performed Dilation: 2 Effacement (%): 50 Station: -3  Extremities: Normal range of  motion.     ental Status: Normal mood and affect. Normal behavior. Normal judgment and thought content.   US OB Follow Up  Result Date: 01/13/2020 Patient Name: Candice Moore DOB: 1991-04-01 MRN: [id number] ULTRASOUND REPORT Location: [facility] Date of Service: 01/13/2020 Indications:growth/afi Findings: Nelda [place] intrauterine pregnancy is visualized with FHR at 148 BPM. Biometrics give an (U/S) Gestational age of [redacted]w[redacted]d and an (U/S) EDD of 02/12/2020; this correlates with the clinically established Estimated Date of Delivery: 02/08/20. Fetal presentation is Cephalic. Placenta: anterior. Grade: 2 AFI: 14.9 cm Growth percentile is 34.4%.  AC percentile is 34.6%. EFW: 2713 g  ( 6 lb 0 oz ) Impression: 1. [redacted]w[redacted]d Viable Singleton Intrauterine pregnancy previously established criteria. 2. Growth is 34.4 %ile.  AFI is 14.9 cm. Recommendations: 1.Clinical correlation with the patient's History and Physical Exam. Gweneth Dimitri, RT There is a singleton gestation with normal amniotic fluid volume. The fetal biometry correlates with established dating.  Limited fetal anatomy was performed.The visualized fetal anatomical survey appears within normal limits within the resolution of ultrasound as described above.  It must be noted that a normal ultrasound is unable to rule out fetal aneuploidy.  Malachy Mood, MD, [facility], [facility] 01/13/2020, 1:56 PM   US OB Follow Up  Result Date: 12/17/2019 Patient Name: Candice Moore DOB: 1991/08/13 MRN: [id number] ULTRASOUND REPORT Location: [facility] Date of Service: 12/17/2019 Indications:growth/afi Findings: Nelda [place] intrauterine pregnancy is visualized with FHR at 134 BPM. Biometrics give an (U/S) Gestational age of [redacted]w[redacted]d and an (U/S) EDD of 02/08/2020; this correlates with the clinically established Estimated Date of Delivery: 02/08/20. Fetal presentation is Cephalic. Placenta: anterior. Grade: 2 AFI: 14.4 cm Growth percentile is  45.6%.  AC percentile is 63.6%. 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 Impression: 1. [redacted]w[redacted]d Viable  Singleton Intrauterine pregnancy previously established criteria. 2. Growth is 45.6 %ile.  AFI is 14.4 cm. Recommendations: 1.Clinical correlation with the patient's History and Physical Exam. Deanna Artis, RT Review of ULTRASOUND.    I have personally reviewed images and report of recent ultrasound done at [facility].    Plan of management to be discussed with patient. Annamarie Major, MD, FACOG [facility], [facility] 12/17/2019  12:04 PM    Assessment   29 y.o. [id number] at [redacted]w[redacted]d by  02/08/2020, by Last Menstrual Period presenting for routine prenatal visit  Plan   FOURTH Problems (from 08/18/19 to present)    Problem Noted Resolved   Supervision of high risk pregnancy, antepartum 08/19/2019 by Farrel Conners, CNM No   Overview Addendum 12/29/2019  9:45 AM by Natale Milch, MD    Clinic [facility] Prenatal Labs  Dating LMP=7wk Blood type:   O POS  Genetic Screen  Antibody: negative  Anatomic [place] Normal anatomy, female gender, anterior placenta Rubella:   Immune Varicella: Non immune  GTT Early: 130              Third trimester: 132 RPR:   non reactive  Rhogam n/a HBsAg: NON REACTIVE (11/13 1629)   TDaP vaccine      3/19      Flu Shot:decl HIV:   non reactive  Baby Food          Breast GBS:   Contraception          BTL Pap: NIL 2020  CBB  No   CS/VBAC Declines CS for h/o shoulder dystocia.  Prior NSVD   Support Person               Gestational age appropriate obstetric precautions including but not limited to vaginal bleeding, contractions, leaking of fluid and fetal movement were reviewed in detail with the patient.    Return in about 1 week (around 01/20/2020) for ROB, NST, AFI.  Vena Austria, MD, [name], [facility] 01/13/2020, 5:21 PM

## 2020-01-19 ENCOUNTER — Ambulatory Visit: Payer: BC Managed Care – PPO | Admitting: Physical Therapy

## 2020-01-19 ENCOUNTER — Telehealth: Payer: Self-pay

## 2020-01-19 NOTE — Telephone Encounter (Signed)
Pt needing refill on medication. Tried to call back to see which medications. Please let me know or find out which meds she neds refilled when she calls back

## 2020-01-21 ENCOUNTER — Other Ambulatory Visit: Payer: Self-pay | Admitting: Obstetrics and Gynecology

## 2020-01-21 ENCOUNTER — Ambulatory Visit (INDEPENDENT_AMBULATORY_CARE_PROVIDER_SITE_OTHER): Payer: BC Managed Care – PPO | Admitting: Advanced Practice Midwife

## 2020-01-21 ENCOUNTER — Encounter: Payer: Self-pay | Admitting: Advanced Practice Midwife

## 2020-01-21 ENCOUNTER — Ambulatory Visit (INDEPENDENT_AMBULATORY_CARE_PROVIDER_SITE_OTHER): Payer: BC Managed Care – PPO

## 2020-01-21 ENCOUNTER — Other Ambulatory Visit: Payer: Self-pay

## 2020-01-21 VITALS — BP 110/70 | Wt 263.0 lb

## 2020-01-21 DIAGNOSIS — Z3A36 36 weeks gestation of pregnancy: Secondary | ICD-10-CM

## 2020-01-21 DIAGNOSIS — O99210 Obesity complicating pregnancy, unspecified trimester: Secondary | ICD-10-CM | POA: Diagnosis not present

## 2020-01-21 DIAGNOSIS — O0990 Supervision of high risk pregnancy, unspecified, unspecified trimester: Secondary | ICD-10-CM

## 2020-01-21 DIAGNOSIS — O0993 Supervision of high risk pregnancy, unspecified, third trimester: Secondary | ICD-10-CM

## 2020-01-21 DIAGNOSIS — Z3A37 37 weeks gestation of pregnancy: Secondary | ICD-10-CM

## 2020-01-21 LAB — FETAL NONSTRESS TEST

## 2020-01-21 MED ORDER — CITRANATAL BLOOM 90-1 MG PO TABS: 1.0000 | ORAL_TABLET | Freq: Every day | ORAL | 7 refills | Status: DC

## 2020-01-21 MED ORDER — FAMOTIDINE 20 MG PO TABS: 20.0000 mg | ORAL_TABLET | Freq: Two times a day (BID) | ORAL | 1 refills | Status: DC

## 2020-01-21 NOTE — Progress Notes (Signed)
Routine Prenatal Care Visit  Subjective  Candice Moore is a 29 y.o. [id number] at [redacted]w[redacted]d being seen today for ongoing prenatal care.  She is currently monitored for the following issues for this high-risk pregnancy and has Hyperemesis affecting pregnancy, antepartum; Hyperemesis complicating pregnancy, antepartum; Hypokalemia; Elevated liver enzymes; Supervision of high risk pregnancy, antepartum; History of shoulder dystocia in prior pregnancy, currently pregnant; Adult BMI 45.0-49.9 kg/sq m ([name]); Obesity in pregnancy; Sacrococcygeal disorders, not elsewhere classified; and Preterm contractions on their problem list.  ----------------------------------------------------------------------------------- Patient reports irregular contractions that are occasionally 5 minutes apart. She notices more at night. She has improved hydration since being out of work. Denies recent intercourse.   Contractions: Irregular. Vag. Bleeding: None.  Movement: Present. Leaking Fluid denies.  ----------------------------------------------------------------------------------- The following portions of the patient's history were reviewed and updated as appropriate: allergies, current medications, past family history, past medical history, past social history, past surgical history and problem list. Problem list updated.  Objective  Blood pressure 110/70, weight 263 lb (119.3 kg), last menstrual period 05/04/2019. Pregravid weight 250 lb (113.4 kg) Total Weight Gain 13 lb (5.897 kg) Urinalysis: Urine Protein    Urine Glucose    Fetal Status: Fetal Heart Rate (bpm): 140   Movement: Present  Presentation: Vertex   AFI: 10.3  NST: reactive 20 minute tracing, 140 bpm baseline, moderate variability, +accelerations to 160, brief variable to 120 with good return to baseline and moderate variability throughout.    General:  Alert, oriented and cooperative. Patient is in no acute distress.  Skin: Skin is warm and dry. No  rash noted.   Cardiovascular: Normal heart rate noted  Respiratory: Normal respiratory effort, no problems with respiration noted  Abdomen: Soft, gravid, appropriate for gestational age. Pain/Pressure: Present     Pelvic:  Cervical exam performed Dilation: 3 Effacement (%): 50 Station: -3  Extremities: Normal range of motion.  Edema: None  Mental Status: Normal mood and affect. Normal behavior. Normal judgment and thought content.   Assessment   29 y.o. [id number] at [redacted]w[redacted]d by  02/08/2020, by Last Menstrual Period presenting for routine prenatal visit  Plan   FOURTH Problems (from 08/18/19 to present)    Problem Noted Resolved   Supervision of high risk pregnancy, antepartum 08/19/2019 by Dalia Heading, CNM No   Overview Addendum 12/29/2019  9:45 AM by Homero Fellers, MD    Clinic [facility] Prenatal Labs  Dating LMP=7wk Blood type:   O POS  Genetic Screen  Antibody: negative  Anatomic [place] Normal anatomy, female gender, anterior placenta Rubella:   Immune Varicella: Non immune  GTT Early: 130              Third trimester: 132 RPR:   non reactive  Rhogam n/a HBsAg: NON REACTIVE (11/13 1629)   TDaP vaccine      3/19      Flu Shot:decl HIV:   non reactive  Baby Food          Breast GBS: negative 4/14  Contraception          BTL Pap: NIL 2020  CBB  No   CS/VBAC Declines CS for h/o shoulder dystocia.  Prior NSVD   Support Person               Term labor symptoms and general obstetric precautions including but not limited to vaginal bleeding, contractions, leaking of fluid and fetal movement were reviewed in detail with the patient. Please refer to After Visit Summary for other counseling  recommendations.   Return in about 1 week (around 01/28/2020) for afi/nst/rob.  Tresea Mall, CNM 01/21/2020 2:16 PM

## 2020-01-21 NOTE — Progress Notes (Signed)
ROB/AFI/NST- contractions for the past two days

## 2020-01-24 ENCOUNTER — Encounter: Payer: Self-pay | Admitting: Obstetrics and Gynecology

## 2020-01-24 ENCOUNTER — Observation Stay
Admission: EM | Admit: 2020-01-24 | Discharge: 2020-01-25 | Disposition: A | Discharge status: Home / Self Care | Payer: BC Managed Care – PPO | Attending: Obstetrics and Gynecology | Admitting: Obstetrics and Gynecology

## 2020-01-24 ENCOUNTER — Other Ambulatory Visit: Payer: Self-pay

## 2020-01-24 DIAGNOSIS — J45909 Unspecified asthma, uncomplicated: Secondary | ICD-10-CM | POA: Insufficient documentation

## 2020-01-24 DIAGNOSIS — Z79899 Other long term (current) drug therapy: Secondary | ICD-10-CM | POA: Insufficient documentation

## 2020-01-24 DIAGNOSIS — O99343 Other mental disorders complicating pregnancy, third trimester: Secondary | ICD-10-CM | POA: Insufficient documentation

## 2020-01-24 DIAGNOSIS — O99513 Diseases of the respiratory system complicating pregnancy, third trimester: Secondary | ICD-10-CM | POA: Insufficient documentation

## 2020-01-24 DIAGNOSIS — O0990 Supervision of high risk pregnancy, unspecified, unspecified trimester: Secondary | ICD-10-CM

## 2020-01-24 DIAGNOSIS — O99213 Obesity complicating pregnancy, third trimester: Secondary | ICD-10-CM | POA: Insufficient documentation

## 2020-01-24 DIAGNOSIS — F419 Anxiety disorder, unspecified: Secondary | ICD-10-CM | POA: Diagnosis not present

## 2020-01-24 DIAGNOSIS — Z3A37 37 weeks gestation of pregnancy: Secondary | ICD-10-CM | POA: Insufficient documentation

## 2020-01-24 DIAGNOSIS — O0993 Supervision of high risk pregnancy, unspecified, third trimester: Secondary | ICD-10-CM | POA: Diagnosis present
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
## 2020-01-25 DIAGNOSIS — O0993 Supervision of high risk pregnancy, unspecified, third trimester: Secondary | ICD-10-CM | POA: Diagnosis not present

## 2020-01-25 DIAGNOSIS — Z3A37 37 weeks gestation of pregnancy: Secondary | ICD-10-CM

## 2020-01-25 DIAGNOSIS — O9A213 Injury, poisoning and certain other consequences of external causes complicating pregnancy, third trimester: Secondary | ICD-10-CM | POA: Diagnosis not present

## 2020-01-25 NOTE — Progress Notes (Signed)
Discharge instructions given to pt. Pt verbalized understanding and knows when to come back if there are any changes. Labor precautions were also given. Pt has no questions and was discharged home.

## 2020-01-25 NOTE — Discharge Summary (Signed)
See Final Progress Note 

## 2020-01-25 NOTE — Discharge Instructions (Signed)

## 2020-01-26 ENCOUNTER — Encounter: Payer: Medicaid Other | Admitting: Physical Therapy

## 2020-01-26 ENCOUNTER — Telehealth: Payer: Self-pay

## 2020-01-26 NOTE — Telephone Encounter (Signed)
Patient states she just lost her mucus plug and is having a little cramping. Patient inquiring what her next step should be. Cb#[phone]
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
## 2020-01-28 ENCOUNTER — Ambulatory Visit (INDEPENDENT_AMBULATORY_CARE_PROVIDER_SITE_OTHER): Payer: BC Managed Care – PPO | Admitting: Advanced Practice Midwife

## 2020-01-28 ENCOUNTER — Encounter: Payer: Self-pay | Admitting: Advanced Practice Midwife

## 2020-01-28 ENCOUNTER — Ambulatory Visit (INDEPENDENT_AMBULATORY_CARE_PROVIDER_SITE_OTHER): Payer: BC Managed Care – PPO

## 2020-01-28 ENCOUNTER — Other Ambulatory Visit: Payer: Self-pay

## 2020-01-28 VITALS — BP 124/84 | Wt 264.0 lb

## 2020-01-28 DIAGNOSIS — O99210 Obesity complicating pregnancy, unspecified trimester: Secondary | ICD-10-CM

## 2020-01-28 DIAGNOSIS — O0993 Supervision of high risk pregnancy, unspecified, third trimester: Secondary | ICD-10-CM | POA: Diagnosis not present

## 2020-01-28 DIAGNOSIS — Z3A38 38 weeks gestation of pregnancy: Secondary | ICD-10-CM

## 2020-01-28 LAB — POCT URINALYSIS DIPSTICK OB
Glucose, UA: NEGATIVE
POC,PROTEIN,UA: NEGATIVE

## 2020-01-28 NOTE — Patient Instructions (Signed)
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
## 2020-01-28 NOTE — Lactation Note (Signed)
Lactation Consultation Note  Patient Name: Candice Moore [id number] Date: 01/28/2020   Lactation student reinforced benefits of breastfeeding per the Ready, Set, Baby curriculum. Aylla Huffine encouraged to review breastfeeding information on Ready, set, Baby web site and information for virtual breastfeeding classes that she reports receiving prior to today. States she has already been taught RSB in clinic. Encouraged mother in her current feeding choices for baby.    Maternal Data    Feeding    LATCH Score                   Interventions    Lactation Tools Discussed/Used     Consult Status      Corlis Hove 01/28/2020, 9:04 AM

## 2020-01-29 ENCOUNTER — Other Ambulatory Visit: Payer: Self-pay

## 2020-01-29 ENCOUNTER — Other Ambulatory Visit
Admission: RE | Admit: 2020-01-29 | Discharge: 2020-01-29 | Disposition: A | Discharge status: Home / Self Care | Payer: BC Managed Care – PPO | Source: Ambulatory Visit | Attending: Obstetrics and Gynecology | Admitting: Obstetrics and Gynecology

## 2020-01-29 DIAGNOSIS — Z20822 Contact with and (suspected) exposure to covid-19: Secondary | ICD-10-CM | POA: Insufficient documentation

## 2020-01-29 DIAGNOSIS — Z01812 Encounter for preprocedural laboratory examination: Secondary | ICD-10-CM | POA: Diagnosis present

## 2020-01-29 LAB — SARS CORONAVIRUS 2 (TAT 6-24 HRS): SARS Coronavirus 2: NEGATIVE

## 2020-01-30 ENCOUNTER — Other Ambulatory Visit: Payer: Self-pay | Admitting: Advanced Practice Midwife

## 2020-01-30 DIAGNOSIS — O0993 Supervision of high risk pregnancy, unspecified, third trimester: Secondary | ICD-10-CM

## 2020-02-01 NOTE — Telephone Encounter (Signed)
Advise

## 2020-02-02 ENCOUNTER — Other Ambulatory Visit: Payer: Self-pay

## 2020-02-02 ENCOUNTER — Encounter: Payer: Self-pay | Admitting: Advanced Practice Midwife

## 2020-02-02 ENCOUNTER — Inpatient Hospital Stay
Admission: RE | Admit: 2020-02-02 | Discharge: 2020-02-03 | DRG: 807 | Disposition: A | Discharge status: Home / Self Care | Payer: BC Managed Care – PPO | Attending: Obstetrics and Gynecology | Admitting: Obstetrics and Gynecology

## 2020-02-02 ENCOUNTER — Encounter: Payer: Medicaid Other | Admitting: Physical Therapy

## 2020-02-02 DIAGNOSIS — O99210 Obesity complicating pregnancy, unspecified trimester: Secondary | ICD-10-CM

## 2020-02-02 DIAGNOSIS — O9902 Anemia complicating childbirth: Secondary | ICD-10-CM | POA: Diagnosis present

## 2020-02-02 DIAGNOSIS — O99214 Obesity complicating childbirth: Principal | ICD-10-CM | POA: Diagnosis present

## 2020-02-02 DIAGNOSIS — E669 Obesity, unspecified: Secondary | ICD-10-CM | POA: Diagnosis present

## 2020-02-02 DIAGNOSIS — O0990 Supervision of high risk pregnancy, unspecified, unspecified trimester: Secondary | ICD-10-CM

## 2020-02-02 DIAGNOSIS — O26893 Other specified pregnancy related conditions, third trimester: Secondary | ICD-10-CM | POA: Diagnosis present

## 2020-02-02 DIAGNOSIS — Z3A39 39 weeks gestation of pregnancy: Secondary | ICD-10-CM

## 2020-02-02 DIAGNOSIS — O09299 Supervision of pregnancy with other poor reproductive or obstetric history, unspecified trimester: Secondary | ICD-10-CM

## 2020-02-02 LAB — CBC
HCT: 33.2 % — ABNORMAL LOW (ref 36.0–46.0)
Hemoglobin: 10.7 g/dL — ABNORMAL LOW (ref 12.0–15.0)
MCH: 25.7 pg — ABNORMAL LOW (ref 26.0–34.0)
MCHC: 32.2 g/dL (ref 30.0–36.0)
MCV: 79.6 fL — ABNORMAL LOW (ref 80.0–100.0)
Platelets: 391 10*3/uL (ref 150–400)
RBC: 4.17 MIL/uL (ref 3.87–5.11)
RDW: 15.1 % (ref 11.5–15.5)
WBC: 10.5 10*3/uL (ref 4.0–10.5)
nRBC: 0 % (ref 0.0–0.2)

## 2020-02-02 LAB — TYPE AND SCREEN
ABO/RH(D): O POS
Antibody Screen: NEGATIVE

## 2020-02-02 MED ORDER — VARICELLA VIRUS VACCINE LIVE 1350 PFU/0.5ML IJ SUSR
0.5000 mL | INTRAMUSCULAR | Status: DC | PRN
  Filled 2020-02-02: qty 0.5

## 2020-02-02 MED ORDER — OXYTOCIN BOLUS FROM INFUSION
500.0000 mL | Freq: Once | INTRAVENOUS | Status: AC
  Administered 2020-02-02: 500 mL via INTRAVENOUS

## 2020-02-02 MED ORDER — PRENATAL MULTIVITAMIN CH
1.0000 | ORAL_TABLET | Freq: Every day | ORAL | Status: DC
  Administered 2020-02-03: 1 via ORAL
  Filled 2020-02-02: qty 1

## 2020-02-02 MED ORDER — OXYTOCIN 40 UNITS IN NORMAL SALINE INFUSION - SIMPLE MED
1.0000 m[IU]/min | INTRAVENOUS | Status: DC
  Administered 2020-02-02: 2 m[IU]/min via INTRAVENOUS
  Filled 2020-02-02: qty 1000

## 2020-02-02 MED ORDER — SENNOSIDES-DOCUSATE SODIUM 8.6-50 MG PO TABS
2.0000 | ORAL_TABLET | ORAL | Status: DC
  Administered 2020-02-03: 2 via ORAL
  Filled 2020-02-02: qty 2

## 2020-02-02 MED ORDER — WITCH HAZEL-GLYCERIN EX PADS: 1.0000 "application " | MEDICATED_PAD | CUTANEOUS | Status: DC | PRN

## 2020-02-02 MED ORDER — MISOPROSTOL 25 MCG QUARTER TABLET
ORAL_TABLET | ORAL | Status: AC
  Filled 2020-02-02: qty 2

## 2020-02-02 MED ORDER — LACTATED RINGERS IV SOLN: INTRAVENOUS | Status: DC

## 2020-02-02 MED ORDER — LIDOCAINE HCL (PF) 1 % IJ SOLN
INTRAMUSCULAR | Status: AC
  Filled 2020-02-02: qty 30

## 2020-02-02 MED ORDER — OXYTOCIN 10 UNIT/ML IJ SOLN
INTRAMUSCULAR | Status: AC
  Filled 2020-02-02: qty 2

## 2020-02-02 MED ORDER — FERROUS SULFATE 325 (65 FE) MG PO TABS
325.0000 mg | ORAL_TABLET | Freq: Two times a day (BID) | ORAL | Status: DC
  Administered 2020-02-03: 325 mg via ORAL
  Filled 2020-02-02: qty 1

## 2020-02-02 MED ORDER — ENOXAPARIN SODIUM 40 MG/0.4ML ~~LOC~~ SOLN
40.0000 mg | SUBCUTANEOUS | Status: DC
  Administered 2020-02-03: 40 mg via SUBCUTANEOUS
  Filled 2020-02-02 (×2): qty 0.4

## 2020-02-02 MED ORDER — LIDOCAINE HCL (PF) 1 % IJ SOLN: 30.0000 mL | INTRAMUSCULAR | Status: DC | PRN

## 2020-02-02 MED ORDER — MISOPROSTOL 200 MCG PO TABS
ORAL_TABLET | ORAL | Status: AC
  Filled 2020-02-02: qty 4

## 2020-02-02 MED ORDER — IBUPROFEN 600 MG PO TABS
600.0000 mg | ORAL_TABLET | Freq: Four times a day (QID) | ORAL | Status: DC
  Administered 2020-02-03: 600 mg via ORAL
  Filled 2020-02-02 (×2): qty 1

## 2020-02-02 MED ORDER — TERBUTALINE SULFATE 1 MG/ML IJ SOLN: 0.2500 mg | Freq: Once | INTRAMUSCULAR | Status: DC | PRN

## 2020-02-02 MED ORDER — DIPHENHYDRAMINE HCL 25 MG PO CAPS: 25.0000 mg | ORAL_CAPSULE | Freq: Four times a day (QID) | ORAL | Status: DC | PRN

## 2020-02-02 MED ORDER — OXYTOCIN 40 UNITS IN NORMAL SALINE INFUSION - SIMPLE MED
2.5000 [IU]/h | INTRAVENOUS | Status: DC
  Administered 2020-02-02: 2.5 [IU]/h via INTRAVENOUS

## 2020-02-02 MED ORDER — HYDROCODONE-ACETAMINOPHEN 5-325 MG PO TABS: 1.0000 | ORAL_TABLET | Freq: Four times a day (QID) | ORAL | Status: DC | PRN

## 2020-02-02 MED ORDER — DIBUCAINE (PERIANAL) 1 % EX OINT: 1.0000 "application " | TOPICAL_OINTMENT | CUTANEOUS | Status: DC | PRN

## 2020-02-02 MED ORDER — IBUPROFEN 600 MG PO TABS
600.0000 mg | ORAL_TABLET | Freq: Four times a day (QID) | ORAL | Status: DC
  Administered 2020-02-02: 600 mg via ORAL
  Filled 2020-02-02: qty 1

## 2020-02-02 MED ORDER — LACTATED RINGERS IV SOLN
500.0000 mL | INTRAVENOUS | Status: DC | PRN
  Administered 2020-02-02: 500 mL via INTRAVENOUS

## 2020-02-02 MED ORDER — ONDANSETRON HCL 4 MG/2ML IJ SOLN: 4.0000 mg | INTRAMUSCULAR | Status: DC | PRN

## 2020-02-02 MED ORDER — ONDANSETRON HCL 4 MG/2ML IJ SOLN
4.0000 mg | Freq: Four times a day (QID) | INTRAMUSCULAR | Status: DC | PRN
  Administered 2020-02-02: 4 mg via INTRAVENOUS
  Filled 2020-02-02: qty 2

## 2020-02-02 MED ORDER — ONDANSETRON HCL 4 MG PO TABS: 4.0000 mg | ORAL_TABLET | ORAL | Status: DC | PRN

## 2020-02-02 MED ORDER — COCONUT OIL OIL
1.0000 "application " | TOPICAL_OIL | Status: DC | PRN
  Administered 2020-02-03: 1 via TOPICAL
  Filled 2020-02-02: qty 120

## 2020-02-02 MED ORDER — ACETAMINOPHEN 325 MG PO TABS
650.0000 mg | ORAL_TABLET | ORAL | Status: DC | PRN
  Administered 2020-02-02 – 2020-02-03 (×4): 650 mg via ORAL
  Filled 2020-02-02 (×4): qty 2

## 2020-02-02 MED ORDER — AMMONIA AROMATIC IN INHA
RESPIRATORY_TRACT | Status: AC
  Filled 2020-02-02: qty 10

## 2020-02-02 MED ORDER — BUTORPHANOL TARTRATE 1 MG/ML IJ SOLN
1.0000 mg | INTRAMUSCULAR | Status: DC | PRN
  Administered 2020-02-02: 1 mg via INTRAVENOUS
  Filled 2020-02-02: qty 1

## 2020-02-02 MED ORDER — SIMETHICONE 80 MG PO CHEW
80.0000 mg | CHEWABLE_TABLET | ORAL | Status: DC | PRN
  Administered 2020-02-03: 80 mg via ORAL
  Filled 2020-02-02: qty 1

## 2020-02-02 MED ORDER — ACETAMINOPHEN 325 MG PO TABS: 650.0000 mg | ORAL_TABLET | ORAL | Status: DC | PRN

## 2020-02-02 MED ORDER — BENZOCAINE-MENTHOL 20-0.5 % EX AERO
1.0000 "application " | INHALATION_SPRAY | CUTANEOUS | Status: DC | PRN
  Administered 2020-02-03: 1 via TOPICAL
  Filled 2020-02-02: qty 56
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
## 2020-02-02 NOTE — Progress Notes (Signed)
Labor Check  Subj:  Complaints: feeling contractions more painfully   Obj:  BP 118/60 (BP Location: Left Arm)   Pulse 64   Temp 97.9 F (36.6 C) (Oral)   Resp 18   Ht 5\' 2"  (1.575 m)   Wt 122 kg   LMP 05/04/2019 (Approximate)   BMI 49.20 kg/m  Dose (milli-units/min) Oxytocin: 6 milli-units/min  Cervix: Dilation: 5 / Effacement (%): 70 / Station: -1   AROM: clear fluid Baseline FHR: 130 beats/min   Variability: moderate   Accelerations: present   Decelerations: absent Contractions: present frequency: 3  q 10 minutes Overall assessment: cat 1  A/P: 29 y.o. 26 female at [redacted]w[redacted]d with IOL.  1.  Labor: decreased pitocin from 10 to 6 millUnits/min, AROM clear fluid  2.  FWB: reassuring, Overall assessment: category 1  3.  GBS negative  4.  Pain: declines medications at this time 5.  Recheck: prn or 2 hours.   [redacted]w[redacted]d, MD, [name], [facility] 02/02/2020 1:35 PM

## 2020-02-02 NOTE — Progress Notes (Signed)
  Candice Moore is a 29 y.o. [id number] at [redacted]w[redacted]d by ultrasound admitted for induction of labor. She has been started on pitocin since her admission.  Subjective: Still relatively comfortable. Resting in bed. No support person or family in the room. She can feel her contractions, but is able to talk through them. Denies any LOF or vaginal bleeding. Ample fetal movement this morning.  Objective: BP 118/60 (BP Location: Left Arm)   Pulse 64   Temp 98 F (36.7 C) (Oral)   Resp 18   Ht 5\' 2"  (1.575 m)   Wt 122 kg   LMP 05/04/2019 (Approximate)   BMI 49.20 kg/m  No intake/output data recorded. No intake/output data recorded.  FHT:  FHR: 130 baseline bpm, variability: moderate,  accelerations:  Present,  decelerations:  Absent UC:   regular, every 4 minutes SVE:   Dilation: 4 Effacement (%): 60, 70 Station: -2 Exam by:: [email or web address], CNM  Labs: Lab Results  Component Value Date   WBC 10.5 02/02/2020   HGB 10.7 (L) 02/02/2020   HCT 33.2 (L) 02/02/2020   MCV 79.6 (L) 02/02/2020   PLT 391 02/02/2020    Assessment / Plan: Induction of labor due to elective desire, history of shoulder dystocia,  progressing well on pitocin  Labor: Progressing normally  Fetal Wellbeing:  Category I Pain Control:  desires unmedicated birth I/D:  n/a Anticipated MOD:  NSVD  04/03/2020 02/02/2020, 9:43 AM

## 2020-02-02 NOTE — Discharge Summary (Signed)
Postpartum Discharge Summary     Patient Name: Candice Moore DOB: [birth year] MRN: [id number]  Date of admission: 02/02/2020 Delivery date:02/02/2020  Delivering provider: Prentice Docker D  Date of discharge: 02/03/2020  Admitting diagnosis: Indication for care in labor or delivery [O75.9] Intrauterine pregnancy: [redacted]w[redacted]d    Secondary diagnosis:  Active Problems:   Postpartum care following vaginal delivery   Supervision of high risk pregnancy, antepartum   History of shoulder dystocia in prior pregnancy, currently pregnant   Obesity in pregnancy   Indication for care in labor or delivery   Encounter for care and examination of lactating mother  Additional problems: none    Discharge diagnosis: Term Pregnancy Delivered                                              Post partum procedures:none Augmentation: AROM and Pitocin Complications: None  Hospital course: Induction of Labor With Vaginal Delivery   29y.o. yo [id number] admitted to the hospital 02/02/2020 for induction of labor.  Indication for induction: Elective.  Patient had an uncomplicated labor course as follows: Membrane Rupture Time/Date: 1:31 PM ,02/02/2020   Delivery Method:Vaginal, Spontaneous  Episiotomy: None  Lacerations:  None  Details of delivery can be found in separate delivery note.  Patient had a routine postpartum course. Patient is discharged home 02/03/20.  Newborn Data: Birth date:02/02/2020  Birth time:2:35 PM  Gender:Female  Living status:Living  Apgars:8 ,9   Magnesium Sulfate received: No BMZ received: No Rhophylac:No MMR:No T-DaP: antepartum on 12/11/2019 Flu: 08/09/2019 Transfusion:No  Physical exam  Vitals:   02/02/20 1936 02/02/20 2342 02/03/20 0439 02/03/20 0700  BP: 107/64 123/77 122/72 120/65  Pulse: 60 65 73 77  Resp: 18 18 20 18   Temp: 98.4 F (36.9 C) 98.6 F (37 C) 98.2 F (36.8 C) 98 F (36.7 C)  TempSrc: Oral Oral Oral   SpO2: 98% 100% 100% 99%  Weight:       Height:       General: alert, cooperative and no distress Lochia: appropriate Uterine Fundus: firm Incision: N/A DVT Evaluation: No evidence of DVT seen on physical exam. Negative Homan's sign. Labs: Lab Results  Component Value Date   WBC 12.4 (H) 02/03/2020   HGB 9.8 (L) 02/03/2020   HCT 30.5 (L) 02/03/2020   MCV 80.3 02/03/2020   PLT 346 02/03/2020   CMP Latest Ref Rng & Units 02/03/2020  Glucose 70 - 99 mg/dL -  BUN 6 - 20 mg/dL -  Creatinine 0.44 - 1.00 mg/dL 0.48  Sodium 135 - 145 mmol/L -  Potassium 3.5 - 5.1 mmol/L -  Chloride 98 - 111 mmol/L -  CO2 22 - 32 mmol/L -  Calcium 8.9 - 10.3 mg/dL -  Total Protein 6.0 - 8.5 g/dL -  Total Bilirubin 0.0 - 1.2 mg/dL -  Alkaline Phos 39 - 117 IU/L -  AST 0 - 40 IU/L -  ALT 0 - 32 IU/L -   Edinburgh Score: Edinburgh Postnatal Depression Scale Screening Tool 02/03/2020  I have been able to laugh and see the funny side of things. 0  I have looked forward with enjoyment to things. 0  I have blamed myself unnecessarily when things went wrong. 0  I have been anxious or worried for no good reason. 0  I have felt scared or panicky for  no good reason. 0  Things have been getting on top of me. 0  I have been so unhappy that I have had difficulty sleeping. 0  I have felt sad or miserable. 0  I have been so unhappy that I have been crying. 0  The thought of harming myself has occurred to me. 0  Edinburgh Postnatal Depression Scale Total 0      After visit meds:  Allergies as of 02/03/2020   No Known Allergies     Medication List    STOP taking these medications   metoCLOPramide 10 MG tablet Commonly known as: REGLAN     TAKE these medications   CitraNatal Bloom 90-1 MG Tabs Take 1 tablet by mouth daily.   enoxaparin 40 MG/0.4ML injection Commonly known as: LOVENOX Inject 0.4 mLs (40 mg total) into the skin daily for 28 days.   famotidine 20 MG tablet Commonly known as: PEPCID Take 1 tablet (20 mg total) by  mouth 2 (two) times daily.   ferrous sulfate 325 (65 FE) MG tablet Take 1 tablet (325 mg total) by mouth 2 (two) times daily with a meal.   ibuprofen 600 MG tablet Commonly known as: ADVIL Take 1 tablet (600 mg total) by mouth every 6 (six) hours.        Discharge home in stable condition Infant Feeding: Breast Infant Disposition:home with mother Discharge instruction: per After Visit Summary and Postpartum booklet. Activity: Advance as tolerated. Pelvic rest for 6 weeks.  Diet: routine diet Anticipated Birth Control: BTL done PP Postpartum Appointment:2 weeks Additional Postpartum F/U: Postpartum Depression checkup Future Appointments:No future appointments. Follow up Visit: Follow-up Information    Will Bonnet, MD. Schedule an appointment as soon as possible for a visit in 2 week(s).   Specialty: Obstetrics and Gynecology Why: PP Mood follow up Contact information: [place] [address] [phone]           Live born female "Candice Moore" Birth Weight:  7lbs 3 0z  APGAR: 8, 9  Newborn Delivery   Birth date/time: 02/02/2020 14:35:00 Delivery type: Vaginal, Spontaneous       SIGNED: Imagene Riches, CNM  02/03/2020 10:24 AM

## 2020-02-03 LAB — CREATININE, SERUM
Creatinine, Ser: 0.48 mg/dL (ref 0.44–1.00)
GFR calc Af Amer: >60 mL/min (ref >60)
GFR calc non Af Amer: >60 mL/min (ref >60)

## 2020-02-03 LAB — CBC
HCT: 30.5 % — ABNORMAL LOW (ref 36.0–46.0)
Hemoglobin: 9.8 g/dL — ABNORMAL LOW (ref 12.0–15.0)
MCH: 25.8 pg — ABNORMAL LOW (ref 26.0–34.0)
MCHC: 32.1 g/dL (ref 30.0–36.0)
MCV: 80.3 fL (ref 80.0–100.0)
Platelets: 346 10*3/uL (ref 150–400)
RBC: 3.8 MIL/uL — ABNORMAL LOW (ref 3.87–5.11)
RDW: 15.1 % (ref 11.5–15.5)
WBC: 12.4 10*3/uL — ABNORMAL HIGH (ref 4.0–10.5)
nRBC: 0 % (ref 0.0–0.2)

## 2020-02-03 LAB — RPR: RPR Ser Ql: NONREACTIVE

## 2020-02-03 MED ORDER — IBUPROFEN 600 MG PO TABS
600.0000 mg | ORAL_TABLET | Freq: Four times a day (QID) | ORAL | Status: DC
  Administered 2020-02-03 (×2): 600 mg via ORAL
  Filled 2020-02-03 (×2): qty 1

## 2020-02-03 MED ORDER — IBUPROFEN 600 MG PO TABS: 600.0000 mg | ORAL_TABLET | Freq: Four times a day (QID) | ORAL | 0 refills | Status: DC

## 2020-02-03 MED ORDER — ENOXAPARIN SODIUM 40 MG/0.4ML ~~LOC~~ SOLN: 40.0000 mg | SUBCUTANEOUS | 0 refills | Status: DC

## 2020-02-03 MED ORDER — FERROUS SULFATE 325 (65 FE) MG PO TABS: 325.0000 mg | ORAL_TABLET | Freq: Two times a day (BID) | ORAL | 3 refills | Status: DC

## 2020-02-03 NOTE — Lactation Note (Signed)
This note was copied from a baby's chart. Lactation Consultation Note  Patient Name: Candice Moore [id number] Date: 02/03/2020 Reason for consult: Initial assessment;Term  LC in to see mom and baby. Mom awake in bed with baby asleep in bassinet, last reported feeding around 4 hours ago.   This is mom's third baby, she breastfed first for 11 months, and a week with the second. Mom cites low milk supply and baby as LGA for main reasons for not breastfeeding with second baby.  Some nipple tenderness noted with current feedings; coconut oil given. LC reviewed deep latch, position of infant, and flanged top/bottom lips to help minimize pain/discomfort.   Baby did wake, LC brought to mom, who worked on waking baby to a more alert state, removed blankets, and offered breast. Baby did latch easily, LC adjust positioning, turning baby in towards mom. Strong latch with deep rhythmic suck pattern.   LC reviewed breastfeeding basics and expectations for days/weeks to come, output expectations, signs of transfer, milk supply and demand. Mom plans to breastfeed for 4-6 weeks and transition to formula/bottle in anticipation for returning to work. Mom states that workplace is not accommodating for pumping/leaving the "line", and that she would prefer to offer bottles while she is away. LC discussed transitioning from breast to bottle and formula, LC support post discharge and during transition.  Mom had no further questions/concerns. Lactation number is written on the whiteboard, encouraged to call out if needed. Mom is hoping to be discharged after 24 hour testing.  Maternal Data Formula Feeding for Exclusion: No Has patient been taught Hand Expression?: Yes Does the patient have breastfeeding experience prior to this delivery?: Yes  Feeding Feeding Type: Breast Fed  LATCH Score Latch: Grasps breast easily, tongue down, lips flanged, rhythmical sucking.  Audible Swallowing: A few with  stimulation  Type of Nipple: Everted at rest and after stimulation  Comfort (Breast/Nipple): Soft / non-tender(some tenderness per mom; no damage)  Hold (Positioning): No assistance needed to correctly position infant at breast.  LATCH Score: 9  Interventions Interventions: Breast feeding basics reviewed  Lactation Tools Discussed/Used     Consult Status Consult Status: Complete    Candice Moore 02/03/2020, 2:13 PM

## 2020-02-03 NOTE — Progress Notes (Signed)
  Post Partum Day 1 Subjective: no complaints, up ad lib, voiding, tolerating PO and denies heavy lochia flow, pelvic pain, dysuria. She is breastfeeding, and the baby has been somewhat sleepy but does latch on well.Candice Moore would like to be discharged today at 24 hours postpartum.  Objective: Blood pressure 120/65, pulse 77, temperature 98 F (36.7 C), resp. rate 18, height 5\' 2"  (1.575 m), weight 122 kg, last menstrual period 05/04/2019, SpO2 99 %, unknown if currently breastfeeding.  Physical Exam:  General: alert, cooperative and moderately obese Lochia: appropriate Uterine Fundus: firm Incision: NA, intact perineum DVT Evaluation: No evidence of DVT seen on physical exam. Negative Homan's sign.  Recent Labs    02/02/20 0534 02/03/20 0611  HGB 10.7* 9.8*  HCT 33.2* 30.5*    Assessment/Plan: Discharge home today at 24 hours PP Follow up in 2 weeks for a mood check with Dr. 04/04/20, and will plan for another appt to discuss her outpatient BTL. Continue her prenates and iron (anemic) Ibuprogen 600mg  po Rx for afterpains. Encouraged to keep baby skin to skin, offer the breast often to build ample milk supply. Discharge instructions  Reviewed and additional printed instructions in AVS.   LOS: 1 day   Jean Rosenthal 02/03/2020, 10:32 AM

## 2020-02-03 NOTE — Progress Notes (Signed)
DC instr reviewed with pt.  Verb u/o of f/u mood check appointment.  Reviewed warning s/s to notify MD for.  Pt verb u/o.

## 2020-02-03 NOTE — Progress Notes (Signed)
DC to home with NB.  To car via auxillary in [facility]

## 2020-02-08 ENCOUNTER — Telehealth: Payer: Self-pay | Admitting: Obstetrics and Gynecology

## 2020-02-08 NOTE — Telephone Encounter (Signed)
Pt wanted me to write her out for 12 weeks for maternity leave. Explained to pt I cannot do that legally and for vaginal delveries, its 6 weeks and c-sections its 8 weeks, unless doctor advises other wise. She would need to discuss with her employer about the 12 weeks. Pt understands and will let me know if I can do anything else to help

## 2020-02-08 NOTE — Telephone Encounter (Signed)
Patient is calling needing to speak with someone about her FMLA forms about the return to date. Please advise

## 2020-02-09 ENCOUNTER — Encounter: Payer: Medicaid Other | Admitting: Physical Therapy

## 2020-02-10 ENCOUNTER — Other Ambulatory Visit: Payer: Self-pay | Admitting: Obstetrics

## 2020-02-10 DIAGNOSIS — O99210 Obesity complicating pregnancy, unspecified trimester: Secondary | ICD-10-CM

## 2020-02-10 DIAGNOSIS — O0990 Supervision of high risk pregnancy, unspecified, unspecified trimester: Secondary | ICD-10-CM

## 2020-02-17 ENCOUNTER — Encounter: Payer: Self-pay | Admitting: Obstetrics and Gynecology

## 2020-02-17 ENCOUNTER — Other Ambulatory Visit: Payer: Self-pay

## 2020-02-17 ENCOUNTER — Ambulatory Visit (INDEPENDENT_AMBULATORY_CARE_PROVIDER_SITE_OTHER): Payer: Medicaid Other | Admitting: Obstetrics and Gynecology

## 2020-02-17 VITALS — BP 126/84 | Ht 62.0 in | Wt 253.0 lb

## 2020-02-17 DIAGNOSIS — Z302 Encounter for sterilization: Secondary | ICD-10-CM

## 2020-02-17 NOTE — Progress Notes (Signed)
Preoperative History and Physical  Candice Moore is a 29 y.o. [phone] here for surgical management of contraception.   No significant preoperative concerns.  History of Present Illness: 29 y.o. [phone] female who is nearly 3 weeks postpartum from an uncomplicated vaginal delivery. She denies issues with depression and anxiety.  She greatly desires to have a tubal ligation.  She signed medicaid paperwork on 11/10/2019.    Proposed surgery: Laparoscopic bilateral tubal ligation.  Past Medical History:  Diagnosis Date  . Anxiety   . Asthma   . GERD (gastroesophageal reflux disease)   . Obesity affecting pregnancy    Past Surgical History:  Procedure Laterality Date  . CHOLECYSTECTOMY     OB History  Gravida Para Term Preterm AB Living  4 3 3  0 1 3  SAB TAB Ectopic Multiple Live Births  0 1 0 0 3    # Outcome Date GA Lbr Len/2nd Weight Sex Delivery Anes PTL Lv  4 Term 02/02/20 [redacted]w[redacted]d 00:58 / 00:06 7 lb 2.6 oz (3.25 kg) M Vag-Spont None  LIV  3 Term 11/02/16 [redacted]w[redacted]d  9 lb 1.3 oz (4.12 kg) M Vag-Spont None  LIV     Complications: Shoulder Dystocia  2 Term 04/03/12 [redacted]w[redacted]d  7 lb 2 oz (3.232 kg) M Vag-Spont EPI  LIV     Complications: PROM (premature rupture of membranes)  1 TAB           Patient denies any other pertinent gynecologic issues.   Current Outpatient Medications on File Prior to Visit  Medication Sig Dispense Refill  . famotidine (PEPCID) 20 MG tablet Take 1 tablet (20 mg total) by mouth 2 (two) times daily. 30 tablet 1  . ferrous sulfate (FERROUSUL) 325 (65 FE) MG tablet Take 1 tablet (325 mg total) by mouth 2 (two) times daily. 60 tablet 1  . ibuprofen (ADVIL) 600 MG tablet Take 1 tablet (600 mg total) by mouth every 6 (six) hours. 30 tablet 0  . Prenatal-DSS-FeCb-FeGl-FA (CITRANATAL BLOOM) 90-1 MG TABS Take 1 tablet by mouth daily. 30 tablet 7   No current facility-administered medications on file prior to visit.   No Known Allergies  Social History:   reports  that she has never smoked. She has never used smokeless tobacco. She reports that she does not drink alcohol or use drugs.  Family History  Problem Relation Age of Onset  . Breast cancer Maternal Grandmother   . Colon cancer Maternal Grandfather     Review of Systems: Noncontributory  PHYSICAL EXAM: Blood pressure 126/84, height 5\' 2"  (1.575 m), weight 253 lb (114.8 kg), unknown if currently breastfeeding. CONSTITUTIONAL: Well-developed, well-nourished female in no acute distress.  HENT:  Normocephalic, atraumatic, External right and left ear normal. Oropharynx is clear and moist EYES: Conjunctivae and EOM are normal. Pupils are equal, round, and reactive to light. No scleral icterus.  NECK: Normal range of motion, supple, no masses SKIN: Skin is warm and dry. No rash noted. Not diaphoretic. No erythema. No pallor. NEUROLGIC: Alert and oriented to person, place, and time. Normal reflexes, muscle tone coordination. No cranial nerve deficit noted. PSYCHIATRIC: Normal mood and affect. Normal behavior. Normal judgment and thought content. CARDIOVASCULAR: Normal heart rate noted, regular rhythm RESPIRATORY: Effort and breath sounds normal, no problems with respiration noted ABDOMEN: Soft, nontender, nondistended. PELVIC: Deferred MUSCULOSKELETAL: Normal range of motion. No edema and no tenderness. 2+ distal pulses.  Labs: No results found for this or any previous visit (from the past 336  hour(s)).  EPDS: 1  Assessment:   ICD-10-CM   1. Encounter for sterilization  Z30.2      Plan: Patient will undergo surgical management with the above-noted surgery.   The risks of surgery were discussed in detail with the patient including but not limited to: bleeding which may require transfusion or reoperation; infection which may require antibiotics; injury to surrounding organs which may involve bowel, bladder, ureters ; need for additional procedures including laparoscopy or laparotomy;  thromboembolic phenomenon, surgical site problems and other postoperative/anesthesia complications. Likelihood of success in alleviating the patient's condition was discussed. Routine postoperative instructions will be reviewed with the patient and her family in detail after surgery.  The patient concurred with the proposed plan, giving informed written consent for the surgery.  Preoperative prophylactic antibiotics, as indicated, and SCDs ordered on call to the OR.    29 y.o. [name]  with undesired fertility, desires permanent sterilization.  Other reversible forms of contraception were discussed with patient; she declines all other modalities. Permanent nature of as well as associated risks of the procedure discussed with patient including but not limited to: risk of regret, permanence of method, bleeding, infection, injury to surrounding organs and need for additional procedures.  Failure risk of 0.5-1% with increased risk of ectopic gestation if pregnancy occurs was also discussed with patient.  She was also offered bilateral salpingectomy for cancer risk reduction, but declines.   Prentice Docker, MD 02/17/2020 12:23 PM

## 2020-02-17 NOTE — H&P (View-Only) (Signed)
Preoperative History and Physical  Candice Moore is a 29 y.o. [phone] here for surgical management of contraception.   No significant preoperative concerns.  History of Present Illness: 29 y.o. [phone] female who is nearly 3 weeks postpartum from an uncomplicated vaginal delivery. She denies issues with depression and anxiety.  She greatly desires to have a tubal ligation.  She signed medicaid paperwork on 11/10/2019.    Proposed surgery: Laparoscopic bilateral tubal ligation.  Past Medical History:  Diagnosis Date  . Anxiety   . Asthma   . GERD (gastroesophageal reflux disease)   . Obesity affecting pregnancy    Past Surgical History:  Procedure Laterality Date  . CHOLECYSTECTOMY     OB History  Gravida Para Term Preterm AB Living  4 3 3  0 1 3  SAB TAB Ectopic Multiple Live Births  0 1 0 0 3    # Outcome Date GA Lbr Len/2nd Weight Sex Delivery Anes PTL Lv  4 Term 02/02/20 [redacted]w[redacted]d 00:58 / 00:06 7 lb 2.6 oz (3.25 kg) M Vag-Spont None  LIV  3 Term 11/02/16 [redacted]w[redacted]d  9 lb 1.3 oz (4.12 kg) M Vag-Spont None  LIV     Complications: Shoulder Dystocia  2 Term 04/03/12 [redacted]w[redacted]d  7 lb 2 oz (3.232 kg) M Vag-Spont EPI  LIV     Complications: PROM (premature rupture of membranes)  1 TAB           Patient denies any other pertinent gynecologic issues.   Current Outpatient Medications on File Prior to Visit  Medication Sig Dispense Refill  . famotidine (PEPCID) 20 MG tablet Take 1 tablet (20 mg total) by mouth 2 (two) times daily. 30 tablet 1  . ferrous sulfate (FERROUSUL) 325 (65 FE) MG tablet Take 1 tablet (325 mg total) by mouth 2 (two) times daily. 60 tablet 1  . ibuprofen (ADVIL) 600 MG tablet Take 1 tablet (600 mg total) by mouth every 6 (six) hours. 30 tablet 0  . Prenatal-DSS-FeCb-FeGl-FA (CITRANATAL BLOOM) 90-1 MG TABS Take 1 tablet by mouth daily. 30 tablet 7   No current facility-administered medications on file prior to visit.   No Known Allergies  Social History:   reports  that she has never smoked. She has never used smokeless tobacco. She reports that she does not drink alcohol or use drugs.  Family History  Problem Relation Age of Onset  . Breast cancer Maternal Grandmother   . Colon cancer Maternal Grandfather     Review of Systems: Noncontributory  PHYSICAL EXAM: Blood pressure 126/84, height 5\' 2"  (1.575 m), weight 253 lb (114.8 kg), unknown if currently breastfeeding. CONSTITUTIONAL: Well-developed, well-nourished female in no acute distress.  HENT:  Normocephalic, atraumatic, External right and left ear normal. Oropharynx is clear and moist EYES: Conjunctivae and EOM are normal. Pupils are equal, round, and reactive to light. No scleral icterus.  NECK: Normal range of motion, supple, no masses SKIN: Skin is warm and dry. No rash noted. Not diaphoretic. No erythema. No pallor. NEUROLGIC: Alert and oriented to person, place, and time. Normal reflexes, muscle tone coordination. No cranial nerve deficit noted. PSYCHIATRIC: Normal mood and affect. Normal behavior. Normal judgment and thought content. CARDIOVASCULAR: Normal heart rate noted, regular rhythm RESPIRATORY: Effort and breath sounds normal, no problems with respiration noted ABDOMEN: Soft, nontender, nondistended. PELVIC: Deferred MUSCULOSKELETAL: Normal range of motion. No edema and no tenderness. 2+ distal pulses.  Labs: No results found for this or any previous visit (from the past 336  hour(s)).  EPDS: 1  Assessment:   ICD-10-CM   1. Encounter for sterilization  Z30.2      Plan: Patient will undergo surgical management with the above-noted surgery.   The risks of surgery were discussed in detail with the patient including but not limited to: bleeding which may require transfusion or reoperation; infection which may require antibiotics; injury to surrounding organs which may involve bowel, bladder, ureters ; need for additional procedures including laparoscopy or laparotomy;  thromboembolic phenomenon, surgical site problems and other postoperative/anesthesia complications. Likelihood of success in alleviating the patient's condition was discussed. Routine postoperative instructions will be reviewed with the patient and her family in detail after surgery.  The patient concurred with the proposed plan, giving informed written consent for the surgery.  Preoperative prophylactic antibiotics, as indicated, and SCDs ordered on call to the OR.    29 y.o. [id number]  with undesired fertility, desires permanent sterilization.  Other reversible forms of contraception were discussed with patient; she declines all other modalities. Permanent nature of as well as associated risks of the procedure discussed with patient including but not limited to: risk of regret, permanence of method, bleeding, infection, injury to surrounding organs and need for additional procedures.  Failure risk of 0.5-1% with increased risk of ectopic gestation if pregnancy occurs was also discussed with patient.  She was also offered bilateral salpingectomy for cancer risk reduction, but declines.   Dorell Gatlin, MD 02/17/2020 12:23 PM    

## 2020-02-18 ENCOUNTER — Telehealth: Payer: Self-pay | Admitting: Obstetrics and Gynecology

## 2020-02-19 NOTE — Telephone Encounter (Signed)
-----   Message from Conard Novak, MD sent at 02/17/2020  5:20 PM EDT ----- Regarding: Schedule surgery Surgery Booking Request Patient Full Name:  Candice Moore  MRN: [id number]  DOB: 22-Jul-1991  Surgeon: [name], MD  Requested Surgery Date and Time: either 6/10 or 03/08/20 Primary Diagnosis AND Code: Encounter for Sterilization [Z30.2] Secondary Diagnosis and Code:  Surgical Procedure: laparoscopic bilateral tubal ligation with Filshie clips L&D Notification: No Admission Status: same day surgery Length of Surgery: 30 minutes Special Case Needs: No H&P: No Phone Interview???:  Yes Interpreter: No Language:  Medical Clearance:  No Special Scheduling Instructions: may obtain consents day of surgery Any known health/anesthesia issues, diabetes, sleep apnea, latex allergy, defibrillator/pacemaker?: No Acuity: P3   (P1 highest, P2 delay may cause harm, P3 low, elective gyn, P4 lowest)

## 2020-02-19 NOTE — Telephone Encounter (Signed)
Spoke with pt and adv that I discussed dates with Candice Moore and he was trying to do her surgery sooner rather than later so that she recover while still on maternity leave.  DOS 6/10  H&P not needed per [facility] 6/8 @ 8-10:30, [address], drive up and wear mask. Adv pt to quar until DOS.  Requesting pre-admit appt and it will be listed on pt MyChart.  Also adv that she may rec phone calls from hosp pharmacy and pre service ctr  Conf pt has Medicaid

## 2020-02-24 ENCOUNTER — Other Ambulatory Visit: Payer: Self-pay | Admitting: Obstetrics

## 2020-02-24 DIAGNOSIS — O0990 Supervision of high risk pregnancy, unspecified, unspecified trimester: Secondary | ICD-10-CM

## 2020-02-24 DIAGNOSIS — O99210 Obesity complicating pregnancy, unspecified trimester: Secondary | ICD-10-CM

## 2020-02-25 ENCOUNTER — Other Ambulatory Visit: Payer: Self-pay | Admitting: Obstetrics

## 2020-02-29 ENCOUNTER — Encounter
Admission: RE | Admit: 2020-02-29 | Discharge: 2020-02-29 | Disposition: A | Discharge status: Home / Self Care | Payer: BC Managed Care – PPO | Source: Ambulatory Visit | Attending: Obstetrics and Gynecology | Admitting: Obstetrics and Gynecology

## 2020-02-29 ENCOUNTER — Other Ambulatory Visit: Payer: Self-pay

## 2020-02-29 HISTORY — DX: Anemia, unspecified: D64.9

## 2020-02-29 NOTE — Patient Instructions (Signed)
Your procedure is scheduled on: 03/03/20 Report to DAY SURGERY DEPARTMENT LOCATED ON 2ND FLOOR MEDICAL MALL ENTRANCE. To find out your arrival time please call [phone] between 1PM - 3PM on 03/02/20.  Remember: Instructions that are not followed completely may result in serious medical risk, up to and including death, or upon the discretion of your surgeon and anesthesiologist your surgery may need to be rescheduled.     _X__ 1. Do not eat food after midnight the night before your procedure.                 No gum chewing or hard candies. You may drink clear liquids up to 2 hours                 before you are scheduled to arrive for your surgery- DO not drink clear                 liquids within 2 hours of the start of your surgery.                 Clear Liquids include:  water, apple juice without pulp, clear carbohydrate                 drink such as Clearfast or Gatorade, Black Coffee or Tea (Do not add                 anything to coffee or tea). Diabetics water only  __X__2.  On the morning of surgery brush your teeth with toothpaste and water, you                 may rinse your mouth with mouthwash if you wish.  Do not swallow any              toothpaste of mouthwash.     _X__ 3.  No Alcohol for 24 hours before or after surgery.   _X__ 4.  Do Not Smoke or use e-cigarettes For 24 Hours Prior to Your Surgery.                 Do not use any chewable tobacco products for at least 6 hours prior to                 surgery.  ____  5.  Bring all medications with you on the day of surgery if instructed.   __X__  6.  Notify your doctor if there is any change in your medical condition      (cold, fever, infections).     Do not wear jewelry, make-up, hairpins, clips or nail polish. Do not wear lotions, powders, or perfumes.  Do not shave 48 hours prior to surgery. Men may shave face and neck. Do not bring valuables to the hospital.    [facility] is not responsible for any belongings or  valuables.  Contacts, dentures/partials or body piercings may not be worn into surgery. Bring a case for your contacts, glasses or hearing aids, a denture cup will be supplied. Leave your suitcase in the car. After surgery it may be brought to your room. For patients admitted to the hospital, discharge time is determined by your treatment team.   Patients discharged the day of surgery will not be allowed to drive home.   Please read over the following fact sheets that you were given:   MRSA Information  __X__ Take these medicines the morning of surgery with A SIP OF WATER:  1. NONE  2.   3.   4.  5.  6.  ____ Fleet Enema (as directed)   __X__ Use CHG Soap/SAGE wipes as directed  ____ Use inhalers on the day of surgery  ____ Stop metformin/Janumet/Farxiga 2 days prior to surgery    ____ Take 1/2 of usual insulin dose the night before surgery. No insulin the morning          of surgery.   ____ Stop Blood Thinners Coumadin/Plavix/Xarelto/Pleta/Pradaxa/Eliquis/Effient/Aspirin  on   Or contact your Surgeon, Cardiologist or Medical Doctor regarding  ability to stop your blood thinners  __X__ Stop Anti-inflammatories 7 days before surgery such as Advil, Ibuprofen, Motrin,  BC or Goodies Powder, Naprosyn, Naproxen, Aleve, Aspirin    __X__ Stop all herbal supplements, fish oil or vitamin E until after surgery.    ____ Bring C-Pap to the hospital.    YOU WILL NEED TO DRINK THE ENSURE PRE SURGERY DRINK 2 HOURS BEFORE ARRIVING TO THE HOSPITAL

## 2020-03-01 ENCOUNTER — Other Ambulatory Visit
Admission: RE | Admit: 2020-03-01 | Discharge: 2020-03-01 | Disposition: A | Discharge status: Home / Self Care | Payer: BC Managed Care – PPO | Source: Ambulatory Visit | Attending: Obstetrics and Gynecology | Admitting: Obstetrics and Gynecology

## 2020-03-01 DIAGNOSIS — Z20822 Contact with and (suspected) exposure to covid-19: Secondary | ICD-10-CM | POA: Insufficient documentation

## 2020-03-01 DIAGNOSIS — Z01812 Encounter for preprocedural laboratory examination: Secondary | ICD-10-CM | POA: Insufficient documentation

## 2020-03-02 LAB — SARS CORONAVIRUS 2 (TAT 6-24 HRS): SARS Coronavirus 2: NEGATIVE

## 2020-03-03 ENCOUNTER — Ambulatory Visit: Payer: BC Managed Care – PPO | Admitting: Anesthesiology

## 2020-03-03 ENCOUNTER — Encounter
Admission: RE | Disposition: A | Discharge status: Home / Self Care | Payer: Self-pay | Source: Home / Self Care | Attending: Obstetrics and Gynecology

## 2020-03-03 ENCOUNTER — Other Ambulatory Visit: Payer: Self-pay

## 2020-03-03 ENCOUNTER — Encounter: Payer: Self-pay | Admitting: Obstetrics and Gynecology

## 2020-03-03 ENCOUNTER — Ambulatory Visit
Admission: RE | Admit: 2020-03-03 | Discharge: 2020-03-03 | Disposition: A | Discharge status: Home / Self Care | Payer: BC Managed Care – PPO | Attending: Obstetrics and Gynecology | Admitting: Obstetrics and Gynecology

## 2020-03-03 DIAGNOSIS — Z6841 Body Mass Index (BMI) 40.0 and over, adult: Secondary | ICD-10-CM | POA: Insufficient documentation

## 2020-03-03 DIAGNOSIS — Z302 Encounter for sterilization: Secondary | ICD-10-CM | POA: Diagnosis not present

## 2020-03-03 DIAGNOSIS — Z79899 Other long term (current) drug therapy: Secondary | ICD-10-CM | POA: Diagnosis not present

## 2020-03-03 HISTORY — PX: TUBAL LIGATION: SHX77

## 2020-03-03 HISTORY — PX: LAPAROSCOPIC TUBAL LIGATION: SHX1937

## 2020-03-03 LAB — POCT PREGNANCY, URINE: Preg Test, Ur: NEGATIVE

## 2020-03-03 SURGERY — LIGATION, FALLOPIAN TUBE, LAPAROSCOPIC
Anesthesia: General | Laterality: Bilateral

## 2020-03-03 MED ORDER — FENTANYL CITRATE (PF) 100 MCG/2ML IJ SOLN
INTRAMUSCULAR | Status: AC
  Filled 2020-03-03: qty 2

## 2020-03-03 MED ORDER — MIDAZOLAM HCL 2 MG/2ML IJ SOLN
INTRAMUSCULAR | Status: AC
  Filled 2020-03-03: qty 2

## 2020-03-03 MED ORDER — LACTATED RINGERS IV SOLN: INTRAVENOUS | Status: DC

## 2020-03-03 MED ORDER — LIDOCAINE HCL (CARDIAC) PF 100 MG/5ML IV SOSY
PREFILLED_SYRINGE | INTRAVENOUS | Status: DC | PRN
  Administered 2020-03-03: 100 mg via INTRAVENOUS

## 2020-03-03 MED ORDER — PROPOFOL 10 MG/ML IV BOLUS
INTRAVENOUS | Status: DC | PRN
  Administered 2020-03-03: 200 mg via INTRAVENOUS

## 2020-03-03 MED ORDER — ROCURONIUM BROMIDE 100 MG/10ML IV SOLN
INTRAVENOUS | Status: DC | PRN
  Administered 2020-03-03: 50 mg via INTRAVENOUS
  Administered 2020-03-03: 15 mg via INTRAVENOUS

## 2020-03-03 MED ORDER — MIDAZOLAM HCL 2 MG/2ML IJ SOLN
INTRAMUSCULAR | Status: DC | PRN
  Administered 2020-03-03: 2 mg via INTRAVENOUS

## 2020-03-03 MED ORDER — IBUPROFEN 600 MG PO TABS: 600.0000 mg | ORAL_TABLET | Freq: Four times a day (QID) | ORAL | 0 refills | Status: DC | PRN

## 2020-03-03 MED ORDER — PROMETHAZINE HCL 25 MG/ML IJ SOLN: 6.2500 mg | INTRAMUSCULAR | Status: DC | PRN

## 2020-03-03 MED ORDER — KETOROLAC TROMETHAMINE 30 MG/ML IJ SOLN
INTRAMUSCULAR | Status: AC
  Filled 2020-03-03: qty 1

## 2020-03-03 MED ORDER — HYDROCODONE-ACETAMINOPHEN 5-325 MG PO TABS: 1.0000 | ORAL_TABLET | Freq: Four times a day (QID) | ORAL | 0 refills | Status: DC | PRN

## 2020-03-03 MED ORDER — CHLORHEXIDINE GLUCONATE 0.12 % MT SOLN
OROMUCOSAL | Status: AC
  Filled 2020-03-03: qty 15

## 2020-03-03 MED ORDER — ACETAMINOPHEN 160 MG/5ML PO SOLN
325.0000 mg | ORAL | Status: DC | PRN
  Filled 2020-03-03: qty 20.3

## 2020-03-03 MED ORDER — SODIUM CHLORIDE FLUSH 0.9 % IV SOLN
INTRAVENOUS | Status: AC
  Filled 2020-03-03: qty 10

## 2020-03-03 MED ORDER — PROPOFOL 10 MG/ML IV BOLUS
INTRAVENOUS | Status: AC
  Filled 2020-03-03: qty 20

## 2020-03-03 MED ORDER — SUGAMMADEX SODIUM 500 MG/5ML IV SOLN
INTRAVENOUS | Status: DC | PRN
  Administered 2020-03-03: 300 mg via INTRAVENOUS

## 2020-03-03 MED ORDER — FENTANYL CITRATE (PF) 100 MCG/2ML IJ SOLN
INTRAMUSCULAR | Status: AC
  Administered 2020-03-03: 50 ug via INTRAVENOUS
  Filled 2020-03-03: qty 2

## 2020-03-03 MED ORDER — ROCURONIUM BROMIDE 10 MG/ML (PF) SYRINGE
PREFILLED_SYRINGE | INTRAVENOUS | Status: AC
  Filled 2020-03-03: qty 10

## 2020-03-03 MED ORDER — HYDROCODONE-ACETAMINOPHEN 7.5-325 MG PO TABS
ORAL_TABLET | ORAL | Status: AC
  Administered 2020-03-03: 1 via ORAL
  Filled 2020-03-03: qty 1

## 2020-03-03 MED ORDER — FENTANYL CITRATE (PF) 100 MCG/2ML IJ SOLN
INTRAMUSCULAR | Status: DC | PRN
  Administered 2020-03-03 (×2): 25 ug via INTRAVENOUS
  Administered 2020-03-03 (×3): 50 ug via INTRAVENOUS

## 2020-03-03 MED ORDER — DEXAMETHASONE SODIUM PHOSPHATE 10 MG/ML IJ SOLN
INTRAMUSCULAR | Status: DC | PRN
  Administered 2020-03-03: 8 mg via INTRAVENOUS

## 2020-03-03 MED ORDER — FAMOTIDINE 20 MG PO TABS
20.0000 mg | ORAL_TABLET | Freq: Once | ORAL | Status: AC
  Administered 2020-03-03: 20 mg via ORAL

## 2020-03-03 MED ORDER — ACETAMINOPHEN 325 MG PO TABS: 325.0000 mg | ORAL_TABLET | ORAL | Status: DC | PRN

## 2020-03-03 MED ORDER — FENTANYL CITRATE (PF) 100 MCG/2ML IJ SOLN
25.0000 ug | INTRAMUSCULAR | Status: DC | PRN
  Administered 2020-03-03 (×2): 25 ug via INTRAVENOUS

## 2020-03-03 MED ORDER — KETOROLAC TROMETHAMINE 30 MG/ML IJ SOLN: 30.0000 mg | Freq: Once | INTRAMUSCULAR | Status: DC | PRN

## 2020-03-03 MED ORDER — HYDROCODONE-ACETAMINOPHEN 7.5-325 MG PO TABS: 1.0000 | ORAL_TABLET | Freq: Once | ORAL | Status: AC | PRN

## 2020-03-03 MED ORDER — CHLORHEXIDINE GLUCONATE 0.12 % MT SOLN
15.0000 mL | Freq: Once | OROMUCOSAL | Status: AC
  Administered 2020-03-03: 15 mL via OROMUCOSAL

## 2020-03-03 MED ORDER — SUGAMMADEX SODIUM 500 MG/5ML IV SOLN
INTRAVENOUS | Status: AC
  Filled 2020-03-03: qty 5

## 2020-03-03 MED ORDER — ORAL CARE MOUTH RINSE: 15.0000 mL | Freq: Once | OROMUCOSAL | Status: AC

## 2020-03-03 MED ORDER — KETOROLAC TROMETHAMINE 30 MG/ML IJ SOLN
INTRAMUSCULAR | Status: DC | PRN
  Administered 2020-03-03: 30 mg via INTRAVENOUS

## 2020-03-03 MED ORDER — LIDOCAINE HCL (PF) 2 % IJ SOLN
INTRAMUSCULAR | Status: AC
  Filled 2020-03-03: qty 5

## 2020-03-03 MED ORDER — ONDANSETRON HCL 4 MG/2ML IJ SOLN
INTRAMUSCULAR | Status: DC | PRN
  Administered 2020-03-03: 4 mg via INTRAVENOUS

## 2020-03-03 MED ORDER — PROMETHAZINE HCL 25 MG/ML IJ SOLN
INTRAMUSCULAR | Status: AC
  Administered 2020-03-03: 6.25 mg via INTRAVENOUS
  Filled 2020-03-03: qty 1

## 2020-03-03 MED ORDER — FAMOTIDINE 20 MG PO TABS
ORAL_TABLET | ORAL | Status: AC
  Filled 2020-03-03: qty 1

## 2020-03-03 SURGICAL SUPPLY — 35 items
BAG URINE DRAIN 2000ML AR STRL (UROLOGICAL SUPPLIES) ×3 IMPLANT
BLADE SURG SZ11 CARB STEEL (BLADE) ×3 IMPLANT
CATH FOLEY 2WAY  5CC 16FR (CATHETERS) ×2
CATH URTH 16FR FL 2W BLN LF (CATHETERS) ×1 IMPLANT
CHLORAPREP W/TINT 26 (MISCELLANEOUS) ×3 IMPLANT
CLIP FILSHIE TUBAL LIGA STRL (Clip) ×3 IMPLANT
COVER WAND RF STERILE (DRAPES) ×3 IMPLANT
DERMABOND ADVANCED (GAUZE/BANDAGES/DRESSINGS) ×2
DERMABOND ADVANCED .7 DNX12 (GAUZE/BANDAGES/DRESSINGS) ×1 IMPLANT
DRAPE 3/4 80X56 (DRAPES) ×3 IMPLANT
DRAPE LEGGINS SURG 28X43 STRL (DRAPES) ×3 IMPLANT
DRAPE UNDER BUTTOCK W/FLU (DRAPES) ×3 IMPLANT
GLOVE BIO SURGEON STRL SZ7 (GLOVE) ×6 IMPLANT
GLOVE BIOGEL PI IND STRL 7.5 (GLOVE) ×1 IMPLANT
GLOVE BIOGEL PI INDICATOR 7.5 (GLOVE) ×2
GOWN STRL REUS W/ TWL LRG LVL3 (GOWN DISPOSABLE) ×2 IMPLANT
GOWN STRL REUS W/TWL LRG LVL3 (GOWN DISPOSABLE) ×4
GRASPER SUT TROCAR 14GX15 (MISCELLANEOUS) ×3 IMPLANT
KIT PINK PAD W/HEAD ARE REST (MISCELLANEOUS) ×3
KIT PINK PAD W/HEAD ARM REST (MISCELLANEOUS) ×1 IMPLANT
KIT TURNOVER CYSTO (KITS) ×3 IMPLANT
LABEL OR SOLS (LABEL) ×3 IMPLANT
NEEDLE HYPO 22GX1.5 SAFETY (NEEDLE) ×3 IMPLANT
NS IRRIG 500ML POUR BTL (IV SOLUTION) ×3 IMPLANT
PACK LAP CHOLECYSTECTOMY (MISCELLANEOUS) ×3 IMPLANT
PAD OB MATERNITY 4.3X12.25 (PERSONAL CARE ITEMS) ×3 IMPLANT
PAD PREP 24X41 OB/GYN DISP (PERSONAL CARE ITEMS) ×3 IMPLANT
SET TUBE SMOKE EVAC HIGH FLOW (TUBING) ×3 IMPLANT
SOL PREP PVP 2OZ (MISCELLANEOUS) ×3
SOLUTION PREP PVP 2OZ (MISCELLANEOUS) ×1 IMPLANT
SURGILUBE 2OZ TUBE FLIPTOP (MISCELLANEOUS) ×3 IMPLANT
SUT VIC AB 2-0 UR6 27 (SUTURE) ×3 IMPLANT
SUT VIC AB 4-0 FS2 27 (SUTURE) ×3 IMPLANT
TROCAR ENDO BLADELESS 11MM (ENDOMECHANICALS) ×3 IMPLANT
TROCAR XCEL NON-BLD 5MMX100MML (ENDOMECHANICALS) ×3 IMPLANT

## 2020-03-03 NOTE — Discharge Instructions (Signed)

## 2020-03-03 NOTE — Transfer of Care (Signed)
Immediate Anesthesia Transfer of Care Note  Patient: Tresa Endo  Procedure(s) Performed: LAPAROSCOPIC TUBAL LIGATION W FILSHIE CLIPS (Bilateral )  Patient Location: PACU  Anesthesia Type:General  Level of Consciousness: sedated  Airway & Oxygen Therapy: Patient Spontanous Breathing and Patient connected to face mask oxygen  Post-op Assessment: Report given to RN and Post -op Vital signs reviewed and stable  Post vital signs: Reviewed and stable  Last Vitals:  Vitals Value Taken Time  BP 119/81 03/03/20 1311  Temp 36.1 C 03/03/20 1311  Pulse 79 03/03/20 1314  Resp 25 03/03/20 1314  SpO2 100 % 03/03/20 1314  Vitals shown include unvalidated device data.  Last Pain:  Vitals:   03/03/20 1104  TempSrc: Tympanic  PainSc: 0-No pain         Complications: No complications documented.

## 2020-03-03 NOTE — Anesthesia Procedure Notes (Signed)
Procedure Name: Intubation Date/Time: 03/03/2020 12:09 PM Performed by: Karoline Caldwell, CRNA Pre-anesthesia Checklist: Patient identified, Patient being monitored, Timeout performed, Emergency Drugs available and Suction available Patient Re-evaluated:Patient Re-evaluated prior to induction Oxygen Delivery Method: Circle system utilized Preoxygenation: Pre-oxygenation with 100% oxygen Induction Type: IV induction Ventilation: Mask ventilation without difficulty Laryngoscope Size: 3 and McGraph Grade View: Grade I Tube type: Oral Tube size: 7.0 mm Number of attempts: 1 Airway Equipment and Method: Stylet Placement Confirmation: ETT inserted through vocal cords under direct vision,  positive ETCO2 and breath sounds checked- equal and bilateral Secured at: 21 cm Tube secured with: Tape Dental Injury: Teeth and Oropharynx as per pre-operative assessment

## 2020-03-03 NOTE — Anesthesia Postprocedure Evaluation (Signed)
Anesthesia Post Note  Patient: [facility]  Procedure(s) Performed: LAPAROSCOPIC TUBAL LIGATION W FILSHIE CLIPS (Bilateral )  Patient location during evaluation: PACU Anesthesia Type: General Level of consciousness: awake and alert Pain management: pain level controlled Vital Signs Assessment: post-procedure vital signs reviewed and stable Respiratory status: spontaneous breathing, nonlabored ventilation and respiratory function stable Cardiovascular status: blood pressure returned to baseline and stable Postop Assessment: no apparent nausea or vomiting Anesthetic complications: no   No complications documented.   Last Vitals:  Vitals:   03/03/20 1419 03/03/20 1434  BP: 134/69 130/73  Pulse: 61 (!) 55  Resp: 16 16  Temp: 36.9 C (!) 36.4 C  SpO2: 100% 100%    Last Pain:  Vitals:   03/03/20 1434  TempSrc: Temporal  PainSc: 3                  Soraiya Ahner Garry Heater

## 2020-03-03 NOTE — Op Note (Signed)
  Operative Note    Name: [facility]  MRN: [id number] Date of Service: 03/03/2020   Pre-Operative Diagnosis: Multiparity, desires permanent sterilization  Post-Operative Diagnosis: Multiparity, desires permanent sterilization  Procedures: Laparoscopic bilateral tubal ligation using Filshie clips  Primary Surgeon: [name], MD   EBL: 2 mL   IVF: 700 mL   Urine output: 300 mL of clear urine at end of procedure  Specimens: none  Drains: none  Complications: None   Disposition: PACU   Condition: Stable   Findings: normal appearing uterus, bilateral ovaries and fallopian tubes  Procedure Summary:  The patient was taken to the operating room where general anesthesia was administered and found to be adequate. She was placed in the dorsal supine lithotomy position in Weingarten stirrups and prepped and draped in usual sterile fashion. After a timeout was called an indwelling catheter was placed in her bladder. A sponge-on-a-stick was inserted into the vagina for uterine manipulation.   Attention was turned to the abdomen where after injection of local anesthetic, a 5 mm infraumbilical incision was made with the scalpel. Entry into the abdomen was obtained via Optiview trocar technique (a blunt entry technique with camera visualization through the obturator upon entry). Verification of entry into the abdomen was obtained using opening pressures. The abdomen was insufflated with CO2. The camera was introduced through the trocar with verification of atraumatic entry.  An 11 mm suprapubic port was placed under direct intraabdominal camera visualization without difficulty.   Filshie applicator was advanced through the suprapubic port and right fallopian tube elevated.  Filshie clip applied at the proximal tube about 3 cm from the cornu with care to occlude the entire diameter of the tube.  Filshie clip applied without incident.  In a similar fashion, the contralateral tube was  identified, grasped with Filshie applicator and clip was applied to the proximal tube about 3 cm from the cornu without incident.    Bilateral sites were visualized and complete occlusion confirmed.  All instruments were removed, abdomen deflated with the aid of five deep breaths by anesthesia. Skin closed with subcutaneous 4-0 vicryl suture for reinforcement of skin closure.  Surgical skin glue applied. The suprapubic incision was closed with 4-0 Vicryl in a subcuticular fashion and surgical skin glue was applied.    The patient tolerated the procedure well.  Sponge, lap, needle, and instrument counts were correct x 2.  VTE prophylaxis: SCDs. Antibiotic prophylaxis: none indicated and none given. She was awakened in the operating room and was taken to the PACU in stable condition.   [name], MD 03/03/2020 1:05 PM

## 2020-03-03 NOTE — Anesthesia Preprocedure Evaluation (Addendum)
Anesthesia Evaluation  Patient identified by MRN, date of birth, ID band Patient awake    Reviewed: Allergy & Precautions, H&P , NPO status , Patient's Chart, lab work & pertinent test results  Airway Mallampati: III  TM Distance: <3 FB Neck ROM: full    Dental  (+) Teeth Intact   Pulmonary asthma ,    Pulmonary exam normal        Cardiovascular negative cardio ROS Normal cardiovascular exam     Neuro/Psych PSYCHIATRIC DISORDERS Anxiety negative neurological ROS     GI/Hepatic Neg liver ROS, GERD  Controlled,GERD only when pregnant   Endo/Other  Morbid obesity  Renal/GU      Musculoskeletal   Abdominal   Peds  Hematology  (+) Blood dyscrasia, anemia ,   Anesthesia Other Findings Obese  Past Medical History: No date: Anemia No date: Anxiety No date: Asthma No date: GERD (gastroesophageal reflux disease) No date: Obesity affecting pregnancy  Past Surgical History: No date: CHOLECYSTECTOMY     Reproductive/Obstetrics negative OB ROS                           Anesthesia Physical Anesthesia Plan  ASA: III  Anesthesia Plan: General ETT   Post-op Pain Management:    Induction:   PONV Risk Score and Plan: 3 and Ondansetron, Dexamethasone, Midazolam and Treatment may vary due to age or medical condition  Airway Management Planned:   Additional Equipment:   Intra-op Plan:   Post-operative Plan:   Informed Consent: I have reviewed the patients History and Physical, chart, labs and discussed the procedure including the risks, benefits and alternatives for the proposed anesthesia with the patient or authorized representative who has indicated his/her understanding and acceptance.     Dental Advisory Given  Plan Discussed with: Anesthesiologist, CRNA and Surgeon  Anesthesia Plan Comments:        Anesthesia Quick Evaluation

## 2020-03-03 NOTE — Interval H&P Note (Signed)
History and Physical Interval Note:  03/03/2020 11:39 AM  [facility]  has presented today for surgery, with the diagnosis of Encounter for Sterilization Z30.2.  The various methods of treatment have been discussed with the patient and family. After consideration of risks, benefits and other options for treatment, the patient has consented to  Procedure(s): LAPAROSCOPIC TUBAL LIGATION W FILSHIE CLIPS (Bilateral) as a surgical intervention.  The patient's history has been reviewed, patient examined, no change in status, stable for surgery.  I have reviewed the patient's chart and labs.  Questions were answered to the patient's satisfaction.  Consents reviewed and patient is 100% sure she would like to proceed. She has been previously extensively counseled as to the permanent nature of this procedure and the alternatives to this procedure.   [name], MD, [name], [facility] 03/03/2020 11:39 AM

## 2020-03-04 ENCOUNTER — Encounter: Payer: Self-pay | Admitting: Obstetrics and Gynecology

## 2020-03-16 ENCOUNTER — Ambulatory Visit (INDEPENDENT_AMBULATORY_CARE_PROVIDER_SITE_OTHER): Payer: BC Managed Care – PPO | Admitting: Obstetrics and Gynecology

## 2020-03-16 ENCOUNTER — Other Ambulatory Visit: Payer: Self-pay

## 2020-03-16 ENCOUNTER — Encounter: Payer: Self-pay | Admitting: Obstetrics and Gynecology

## 2020-03-16 DIAGNOSIS — Z09 Encounter for follow-up examination after completed treatment for conditions other than malignant neoplasm: Secondary | ICD-10-CM

## 2020-03-16 NOTE — Progress Notes (Signed)
Postpartum Visit   Chief Complaint  Patient presents with  . Postpartum Care    Vaginal delivery 5/11 w/BTL   History of Present Illness: Patient is a 29 y.o. [id number] presents for postpartum visit.  Date of delivery: 02/02/2020 Type of delivery: Vaginal delivery - Vacuum or forceps assisted  no Episiotomy No.  Laceration: no  Pregnancy or labor problems:  no Any problems since the delivery:  no  Newborn Details:  SINGLETON :  1. Baby's name: [name] "Dair - E- El". Birth weight: 7 lb 3 oz Maternal Details:  Breast Feeding:  Breast and formula Post partum depression/anxiety noted:  no Edinburgh Post-Partum Depression Score:  0  Date of last PAP: 07/2019  normal   She underwent a laparoscopic bilateral tubal ligation on 03/03/2020. She denies fevers, chills, nausea, vomiting. She denies issues with the incisions. She has normal bowel and bladder habits.   Past Medical History:  Diagnosis Date  . Anemia   . Anxiety   . Asthma   . GERD (gastroesophageal reflux disease)   . Obesity affecting pregnancy     Past Surgical History:  Procedure Laterality Date  . CHOLECYSTECTOMY    . LAPAROSCOPIC TUBAL LIGATION Bilateral 03/03/2020   Procedure: LAPAROSCOPIC TUBAL LIGATION W FILSHIE CLIPS;  Surgeon: Will Bonnet, MD;  Location: [facility] ORS;  Service: Gynecology;  Laterality: Bilateral;  . TUBAL LIGATION  03/03/2020    Prior to Admission medications   Medication Sig Start Date End Date Taking? Authorizing Provider  ferrous sulfate 325 (65 FE) MG tablet TAKE 1 TABLET BY MOUTH TWICE A DAY 02/25/20   Imagene Riches, CNM  Prenatal-DSS-FeCb-FeGl-FA (CITRANATAL BLOOM) 90-1 MG TABS Take 1 tablet by mouth daily. Patient taking differently: Take 1 tablet by mouth every evening.  01/21/20   Rod Can, CNM    No Known Allergies   Social History   Socioeconomic History  . Marital status: Significant Other    Spouse name: Bunnie Pion  . Number of children: 2  . Years of education:  Not on file  . Highest education level: Not on file  Occupational History  . Occupation: assembly line  Tobacco Use  . Smoking status: Never Smoker  . Smokeless tobacco: Never Used  Vaping Use  . Vaping Use: Never used  Substance and Sexual Activity  . Alcohol use: No  . Drug use: No  . Sexual activity: Yes    Birth control/protection: None, Surgical    Comment: Tubal Ligation  Other Topics Concern  . Not on file  Social History Narrative  . Not on file   Social Determinants of Health   Financial Resource Strain:   . Difficulty of Paying Living Expenses:   Food Insecurity:   . Worried About Charity fundraiser in the Last Year:   . Arboriculturist in the Last Year:   Transportation Needs:   . Film/video editor (Medical):   Marland Kitchen Lack of Transportation (Non-Medical):   Physical Activity:   . Days of Exercise per Week:   . Minutes of Exercise per Session:   Stress:   . Feeling of Stress :   Social Connections:   . Frequency of Communication with Friends and Family:   . Frequency of Social Gatherings with Friends and Family:   . Attends Religious Services:   . Active Member of Clubs or Organizations:   . Attends Archivist Meetings:   Marland Kitchen Marital Status:   Intimate Partner Violence:   . Fear of  Current or Ex-Partner:   . Emotionally Abused:   Marland Kitchen Physically Abused:   . Sexually Abused:     Family History  Problem Relation Age of Onset  . Breast cancer Maternal Grandmother   . Colon cancer Maternal Grandfather     Review of Systems  Constitutional: Negative.   HENT: Negative.   Eyes: Negative.   Respiratory: Negative.   Cardiovascular: Negative.   Gastrointestinal: Negative.   Genitourinary: Negative.   Musculoskeletal: Negative.   Skin: Negative.   Neurological: Negative.   Psychiatric/Behavioral: Negative.      Physical Exam BP 118/76   Wt 258 lb (117 kg)   LMP 03/09/2020   Breastfeeding Yes   BMI 47.19 kg/m   Physical  Exam Constitutional:      General: She is not in acute distress.    Appearance: Normal appearance. She is well-developed.  Genitourinary:     Pelvic exam was performed with patient in the lithotomy position.     Vulva, inguinal canal, urethra, bladder, vagina, uterus, right adnexa and left adnexa normal.     No posterior fourchette tenderness, injury or lesion present.     No cervical friability, lesion, bleeding or polyp.  HENT:     Head: Normocephalic and atraumatic.  Eyes:     General: No scleral icterus.    Conjunctiva/sclera: Conjunctivae normal.  Cardiovascular:     Rate and Rhythm: Normal rate and regular rhythm.     Heart sounds: No murmur heard.  No friction rub. No gallop.   Pulmonary:     Effort: Pulmonary effort is normal. No respiratory distress.     Breath sounds: Normal breath sounds. No wheezing or rales.  Abdominal:     General: Bowel sounds are normal. There is no distension.     Palpations: Abdomen is soft. There is no mass.     Tenderness: There is no abdominal tenderness. There is no guarding or rebound.  Musculoskeletal:        General: Normal range of motion.     Cervical back: Normal range of motion and neck supple.  Neurological:     General: No focal deficit present.     Mental Status: She is alert and oriented to person, place, and time.     Cranial Nerves: No cranial nerve deficit.  Skin:    General: Skin is warm and dry.     Findings: No erythema.  Psychiatric:        Mood and Affect: Mood normal.        Behavior: Behavior normal.        Judgment: Judgment normal.      Female Chaperone present during breast and/or pelvic exam.  Assessment: 29 y.o. [id number] presenting for 6 week postpartum visit  Plan: Problem List Items Addressed This Visit      Other   Postpartum care following vaginal delivery - Primary    Other Visit Diagnoses    Postop check         1) Contraception: status post BTL  2)  Pap - ASCCP guidelines and rational  discussed.  Patient opts for routine screening interval. She is up to date.  3) Patient underwent screening for postpartum depression with no concerns noted.  4) Follow up 1 year for routine annual exam  [name], MD 03/16/2020 2:47 PM

## 2020-09-01 ENCOUNTER — Other Ambulatory Visit: Payer: Self-pay | Admitting: Obstetrics

## 2020-09-01 DIAGNOSIS — O99210 Obesity complicating pregnancy, unspecified trimester: Secondary | ICD-10-CM

## 2020-09-01 DIAGNOSIS — O0990 Supervision of high risk pregnancy, unspecified, unspecified trimester: Secondary | ICD-10-CM

## 2020-09-02 ENCOUNTER — Other Ambulatory Visit: Payer: Self-pay | Admitting: Obstetrics

## 2020-09-02 DIAGNOSIS — O0990 Supervision of high risk pregnancy, unspecified, unspecified trimester: Secondary | ICD-10-CM

## 2020-09-02 DIAGNOSIS — O99210 Obesity complicating pregnancy, unspecified trimester: Secondary | ICD-10-CM

## 2020-09-02 MED ORDER — FERROUS SULFATE 325 (65 FE) MG PO TABS: 325.0000 mg | ORAL_TABLET | Freq: Two times a day (BID) | ORAL | 1 refills | Status: DC

## 2021-04-27 IMAGING — US US OB < 14 WEEKS - US OB TV
1 series · 14 of 28 positions shown · non-contrast
Comparison: None for this pregnancy

CLINICAL DATA: First trimester pregnancy, unknown gestational age,
dating

EXAM:
OBSTETRIC <14 WK US AND TRANSVAGINAL OB US
TECHNIQUE: Both transabdominal and transvaginal ultrasound examinations were
performed for complete evaluation of the gestation as well as the
maternal uterus, adnexal regions, and pelvic cul-de-sac.
Transvaginal technique was performed to assess early pregnancy.

[Series 1: us ob < 14 weeks - us ob tv · 14 of 92 slices shown]
[im 4/92]
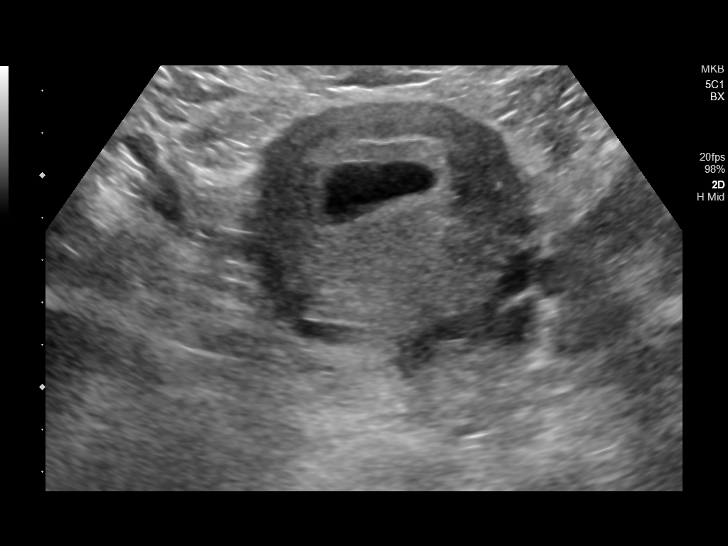
[im 11/92]
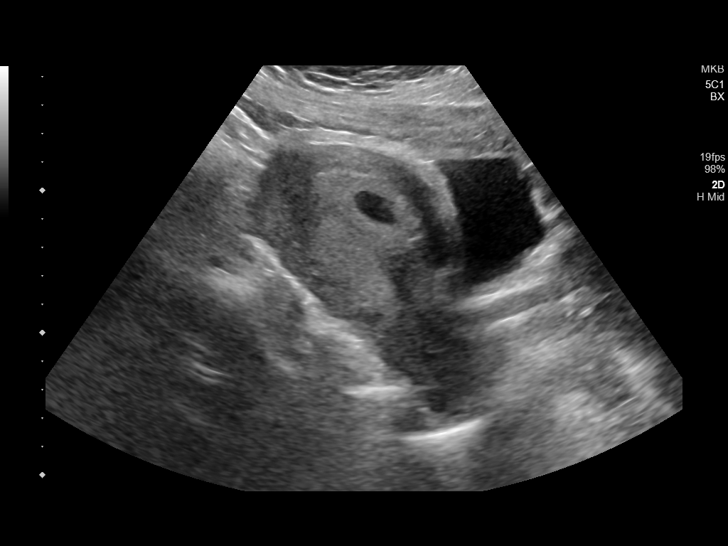
[im 17/92]
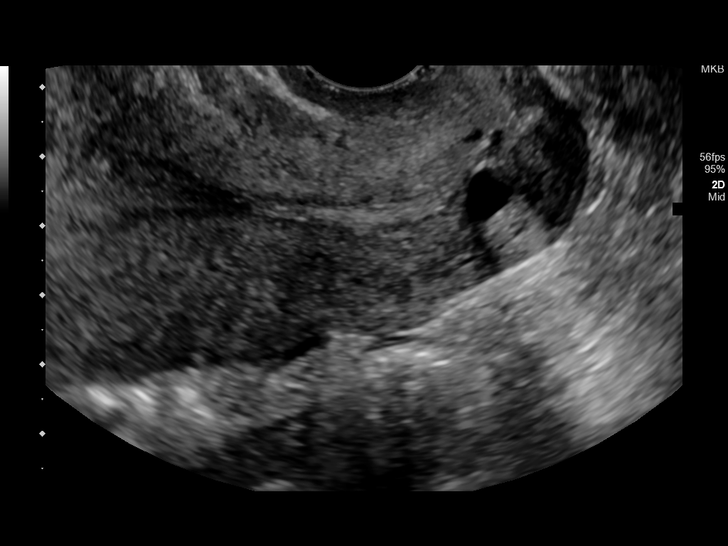
[im 24/92]
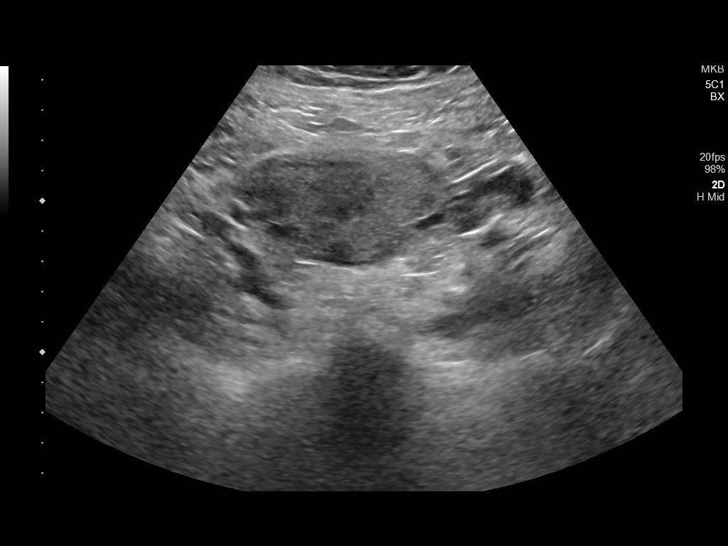
[im 31/92]
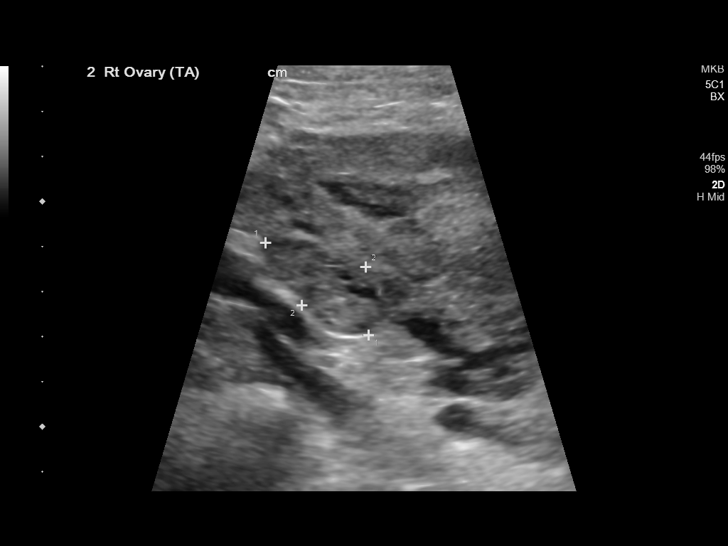
[im 38/92]
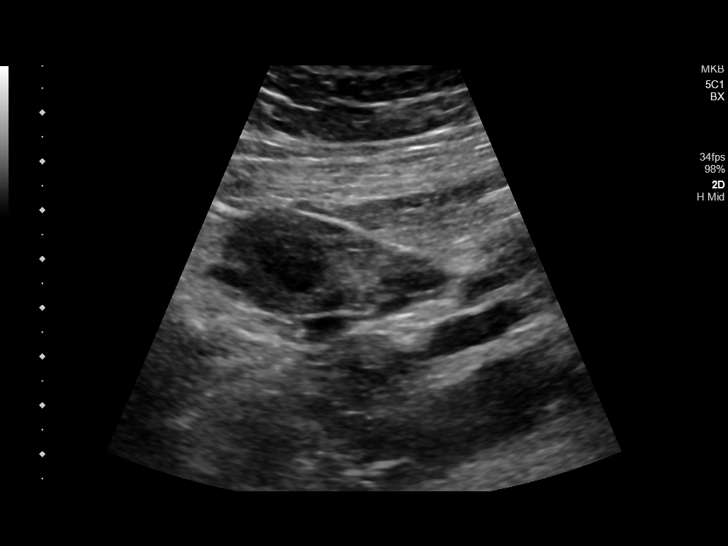
[im 44/92]
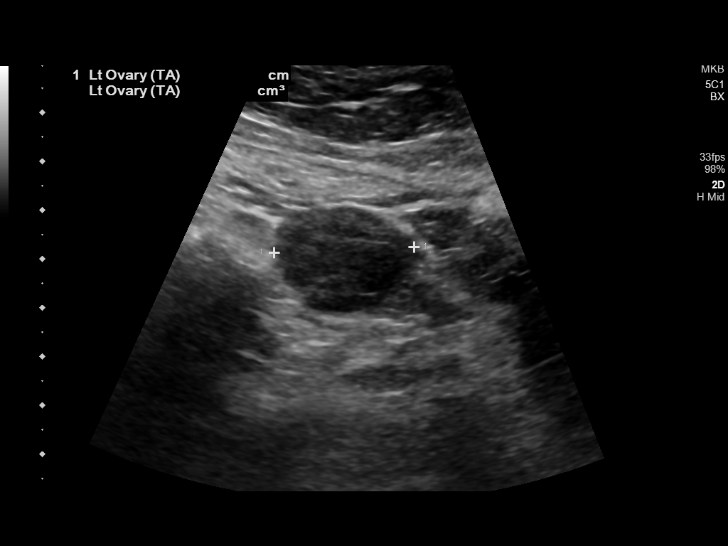
[im 51/92]
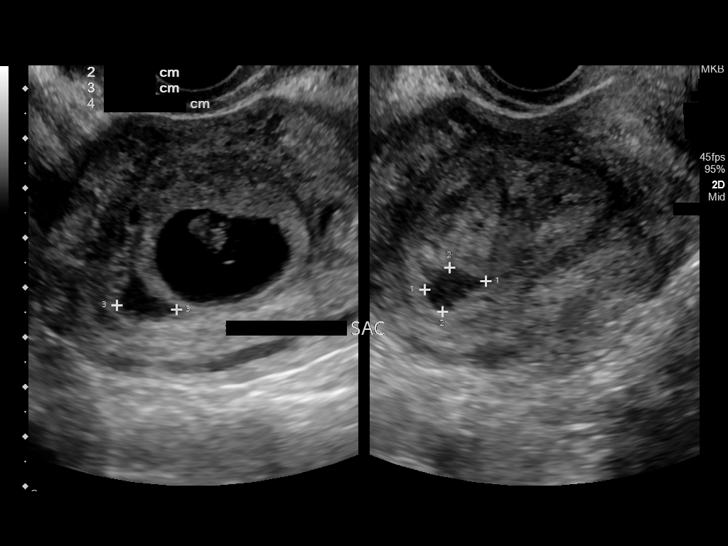
[im 58/92]
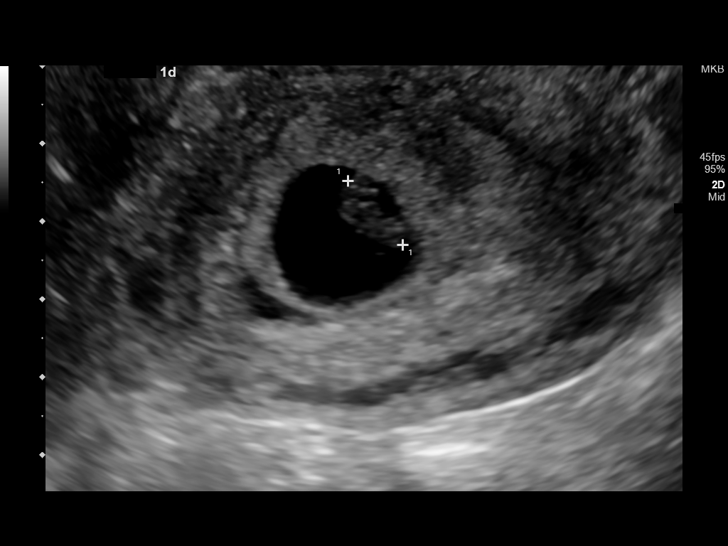
[im 65/92]
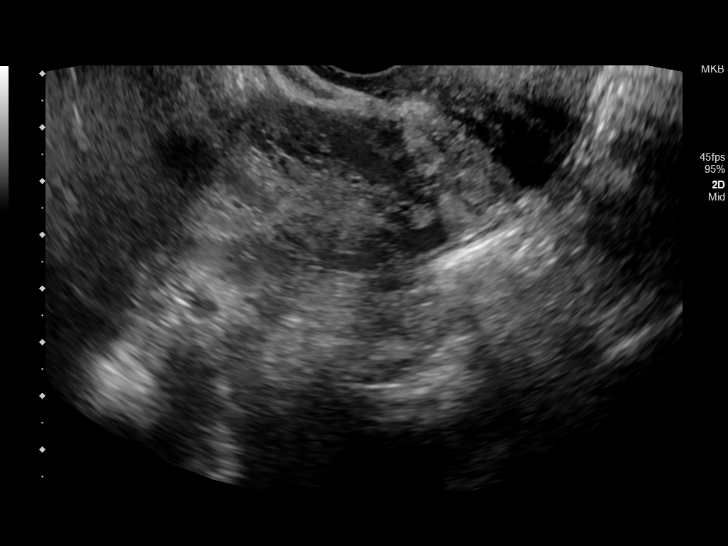
[im 71/92]
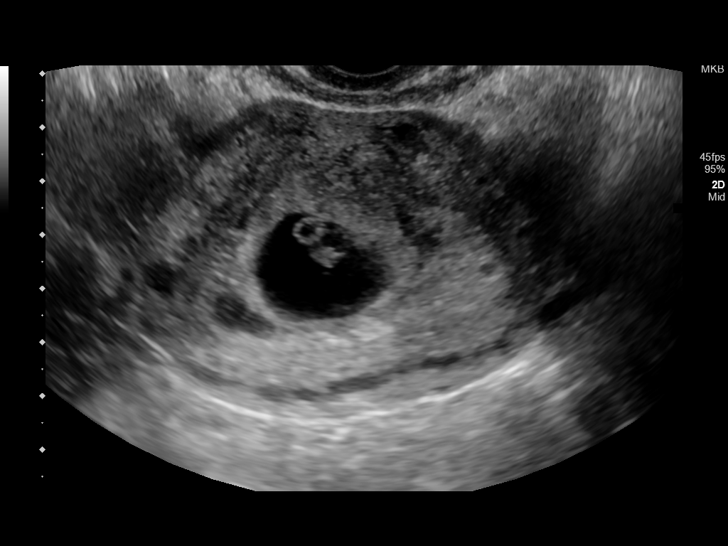
[im 78/92]
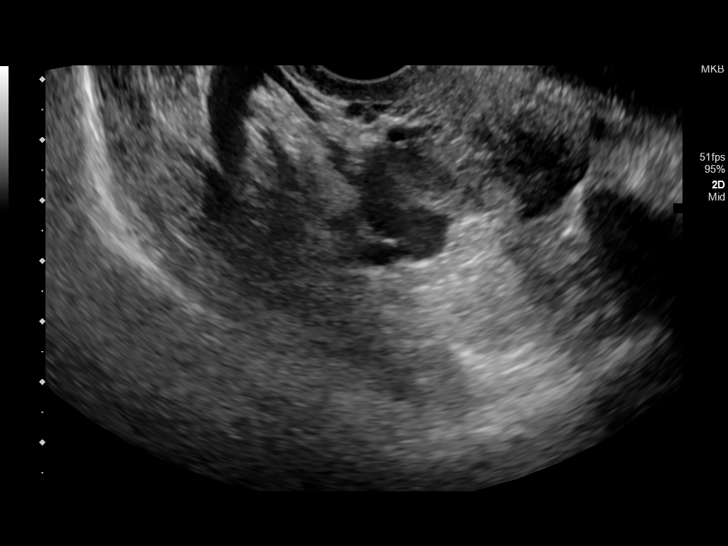
[im 85/92]
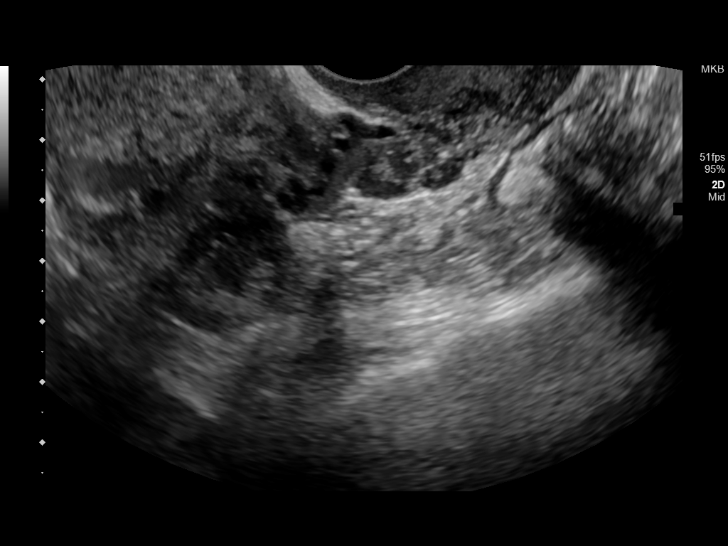
[im 92/92]
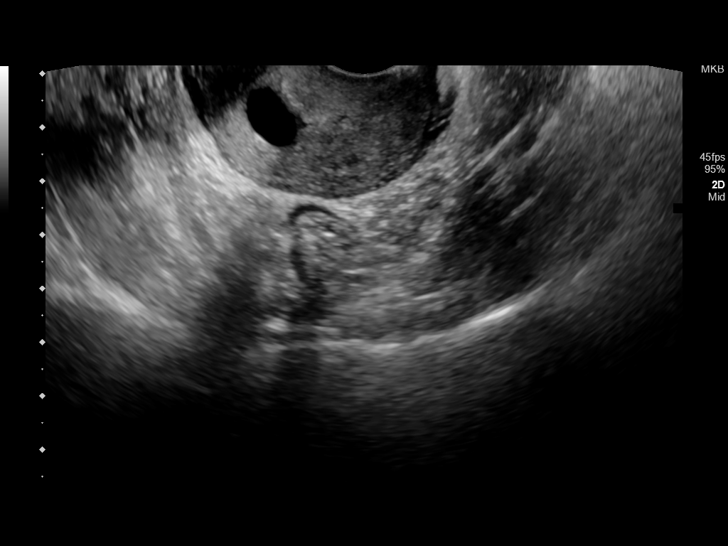

[14 of 28 positions shown; findings below may reference images not displayed]

FINDINGS: Intrauterine gestational sac: Present, single

Yolk sac:  Present

Embryo:  Present

Cardiac Activity: Present

Heart Rate: 153 bpm

CRL:  10.8 mm   7 w   1 d                  US EDC: 02/08/2020

Subchorionic hemorrhage: Small subchronic hemorrhage, 12 x 9 x 12 mm

Maternal uterus/adnexae:

RIGHT ovary normal size and morphology, 3.3 x 1.3 x 2.2 cm

LEFT ovary measures 3.3 x 2.2 x 2.9 cm and contains a small corpus
luteal cyst.

No free pelvic fluid or adnexal masses.

Nabothian cysts noted at cervix.
IMPRESSION: Single live intrauterine gestation at 7 weeks 1 day EGA by
crown-rump length.

Small subchronic hemorrhage.

## 2021-06-10 IMAGING — US US OB COMP LESS 14 WK
1 series · 14 of 28 positions shown · non-contrast
Comparison: Ultrasound 06/23/2019.

CLINICAL DATA: Generalized abdominal pain.

EXAM:
OBSTETRIC <14 WK ULTRASOUND
TECHNIQUE: Transabdominal ultrasound was performed for evaluation of the
gestation as well as the maternal uterus and adnexal regions.

[Series 1: us ob comp less 14 wk · 0.25mm/px · 34 acquisitions, 14 frames shown]
[im 2/34]
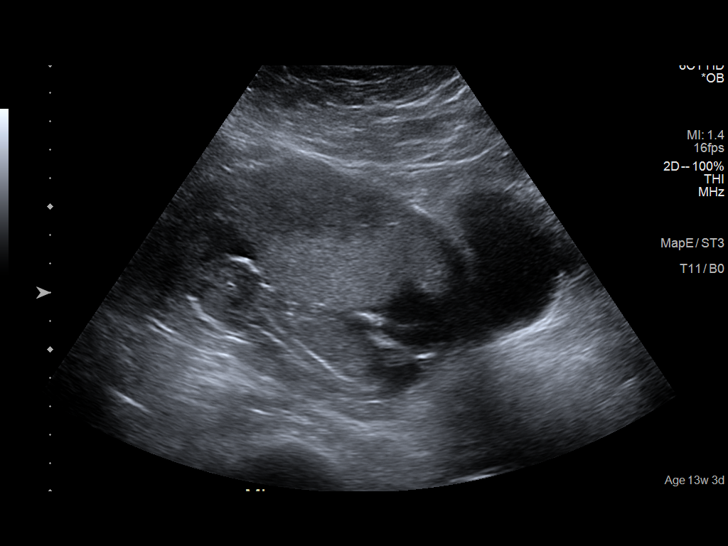
[im 4/34]
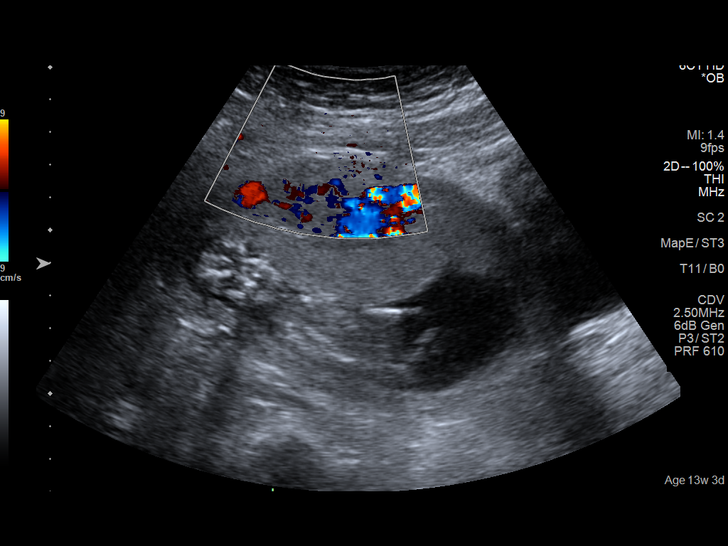
[im 7/34]
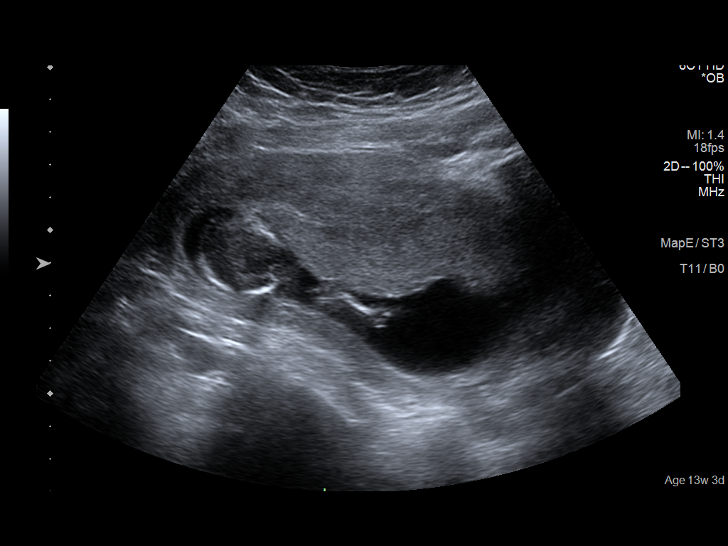
[im 9/34]
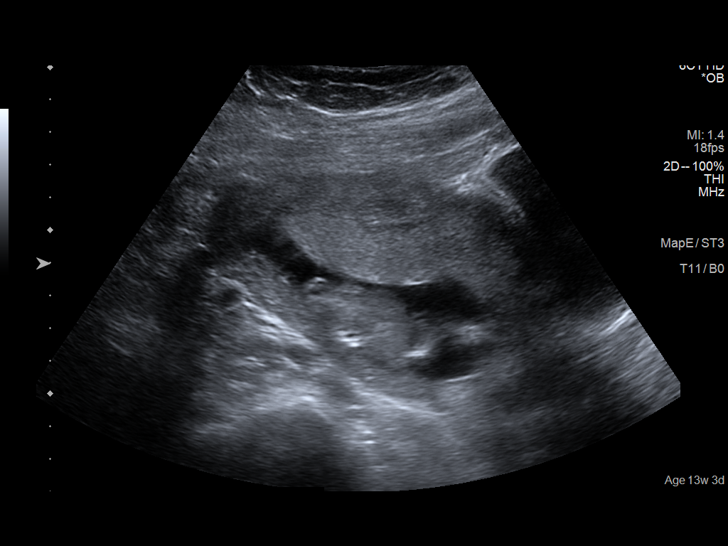
[im 12/34]
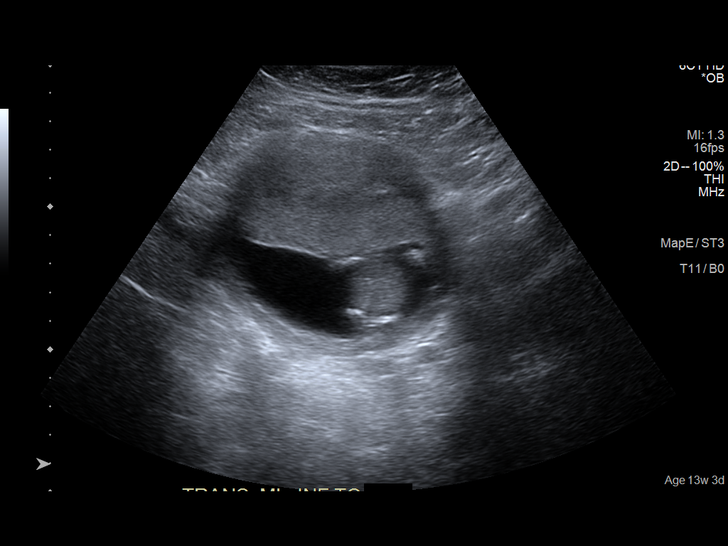
[im 14/34]
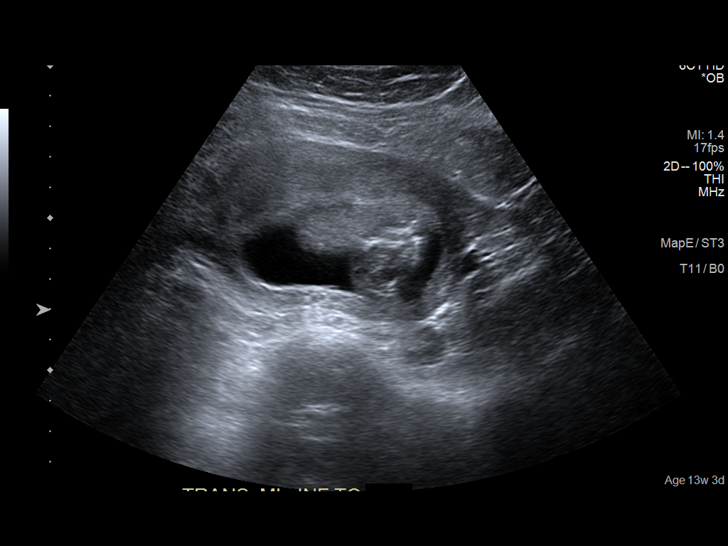
[im 16/34]
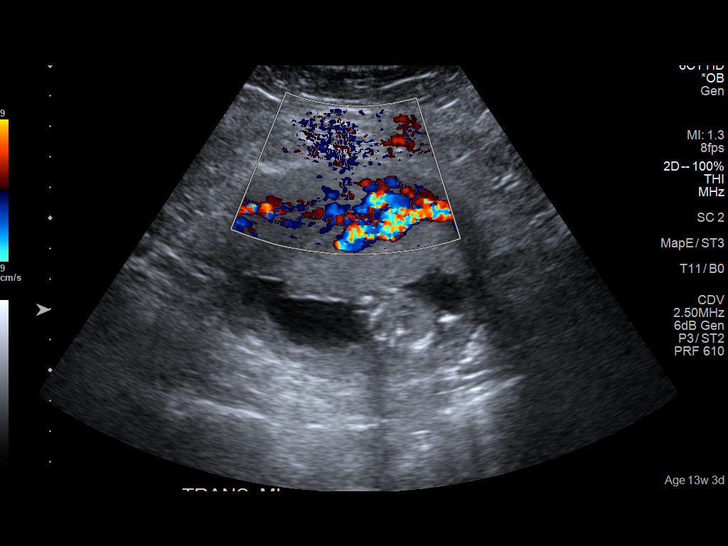
[im 19/34]
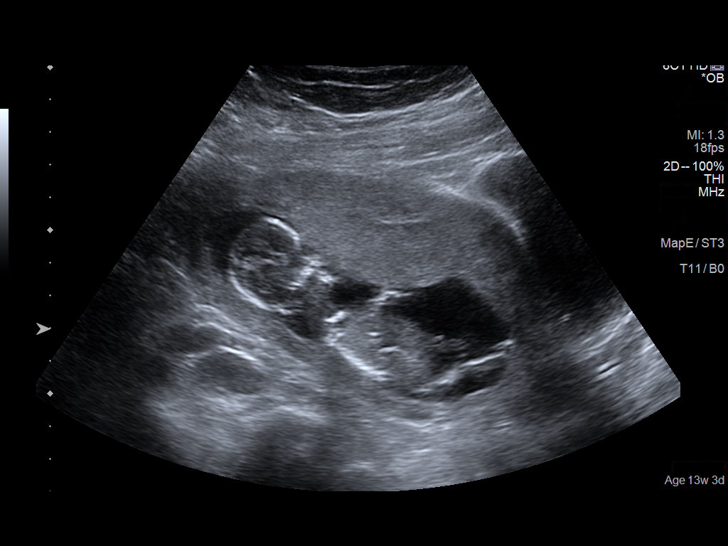
[im 21/34]
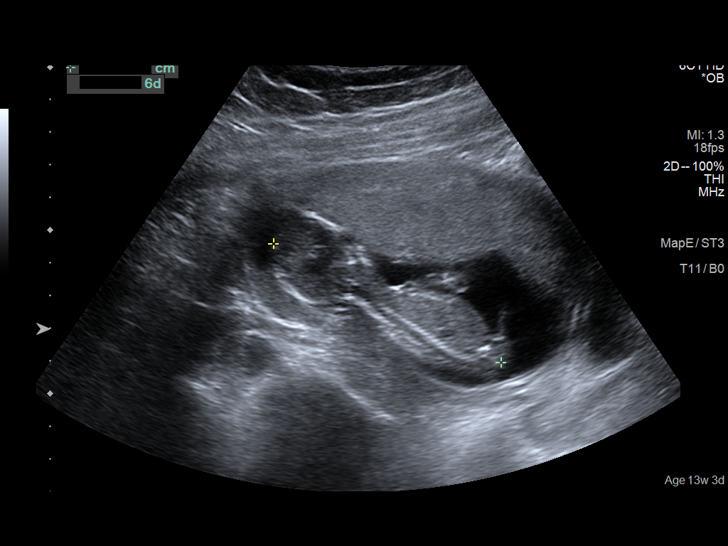
[im 24/34]
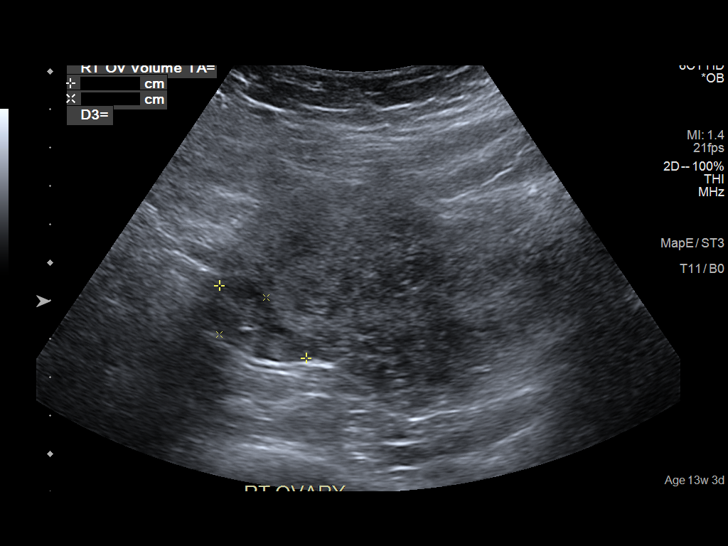
[im 26/34]
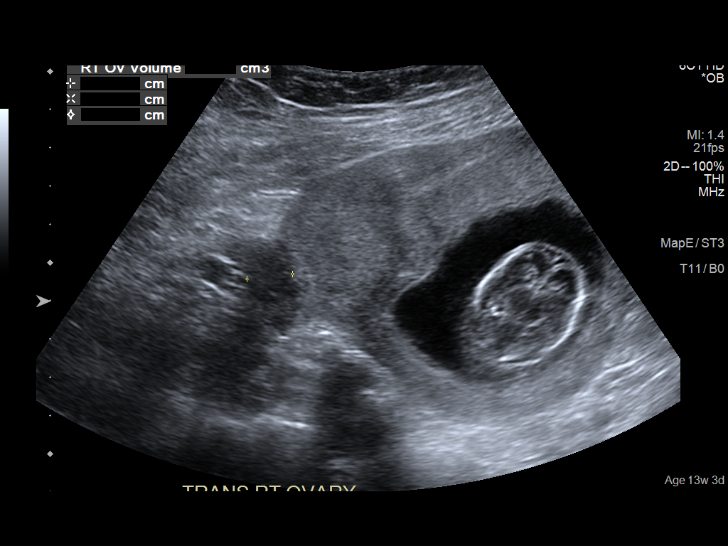
[im 29/34]
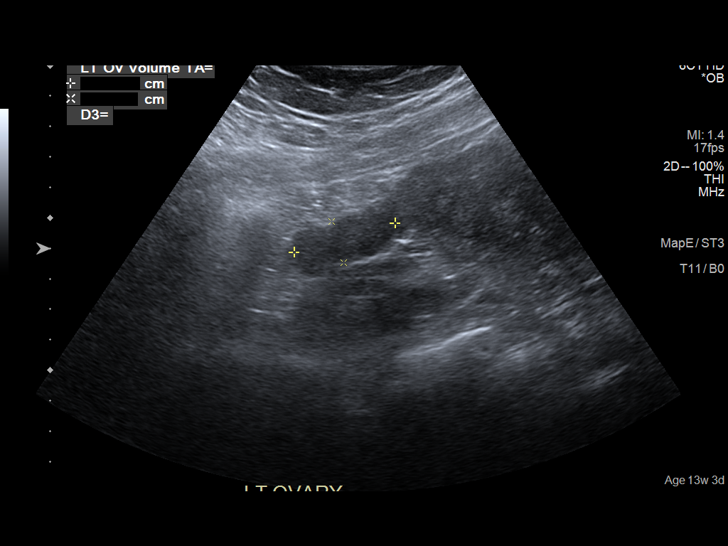
[im 31/34]
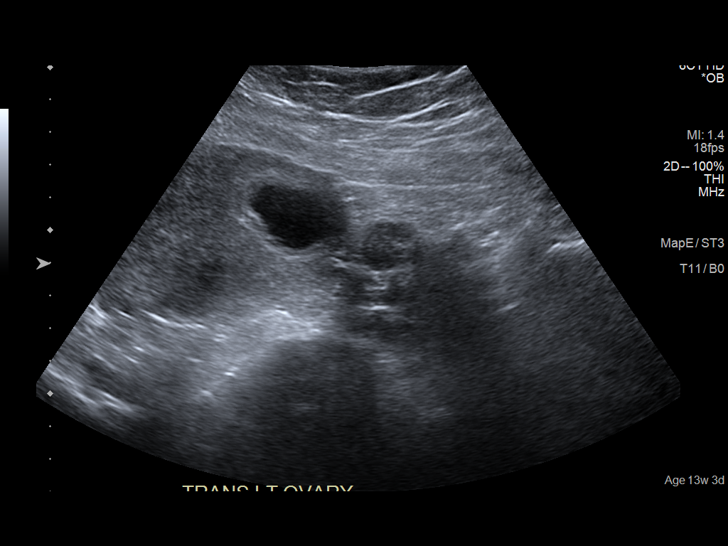
[im 34/34]
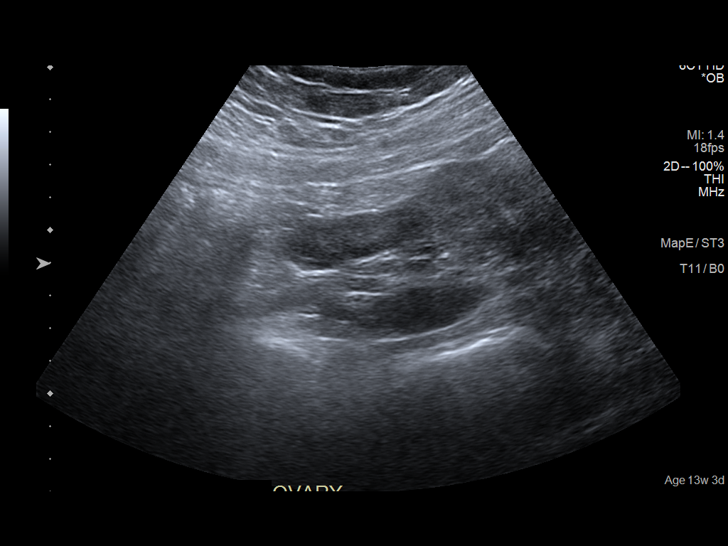

[14 of 28 positions shown; findings below may reference images not displayed]

FINDINGS: Intrauterine gestational sac: Single

Yolk sac:  Not visualized

Embryo:  Visualized

Cardiac Activity: Visualized

Heart Rate: 47 bpm

CRL: 78.9 mm   13 w 6 d                  US EDC: 02/05/2020

Subchorionic hemorrhage:  None visualized

Maternal uterus/adnexae: Jaylon Aujla contraction noted. No other
abnormality identified.
IMPRESSION: Single viable intrauterine pregnancy at 13 weeks 6 days. Andoyo
Donny contraction noted. No subchorionic hemorrhage noted on today's
exam as noted on prior study of 06/23/2019.

## 2021-06-10 IMAGING — US US ABDOMEN LIMITED
1 series · 13 of 25 positions shown · non-contrast
Comparison: Ultrasound 07/29/2019.  CT 08/25/2014.

CLINICAL DATA: Generalized abdominal pain.

EXAM:
ULTRASOUND ABDOMEN LIMITED RIGHT UPPER QUADRANT

[Series 1: us abdomen limited · 0.28mm/px · 13 of 30 slices shown]
[im 1/30]
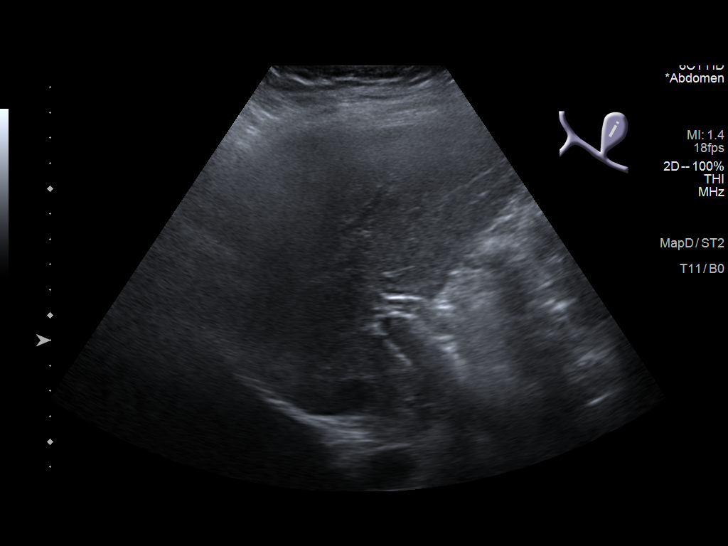
[im 3/30]
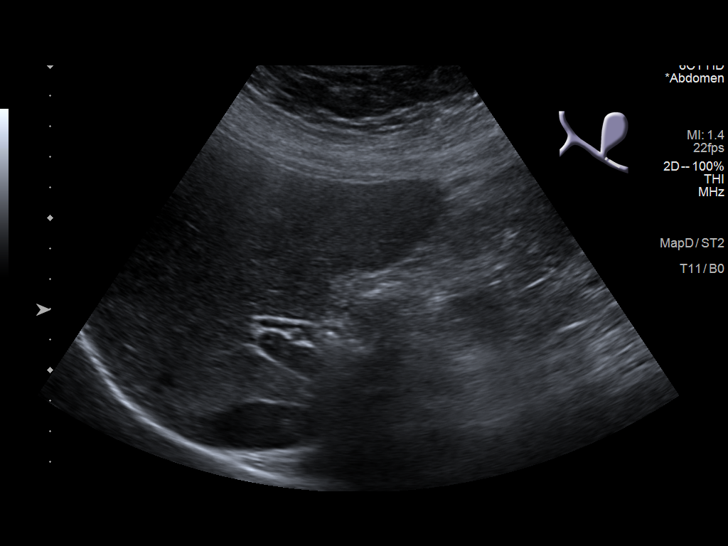
[im 5/30]
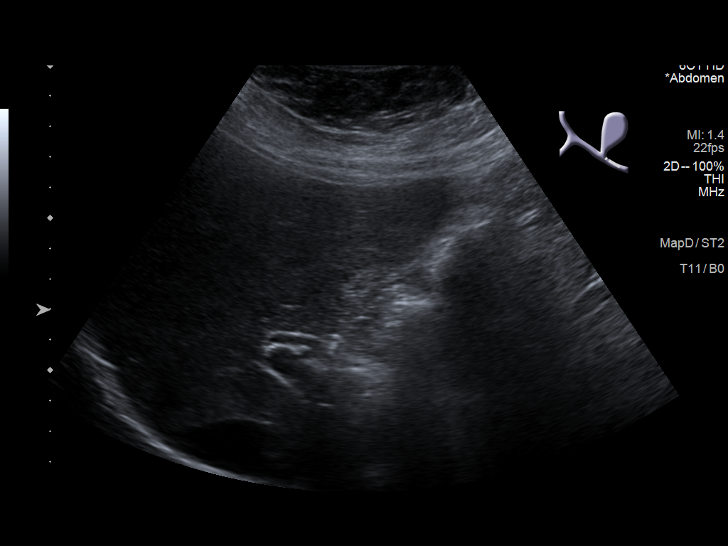
[im 8/30]
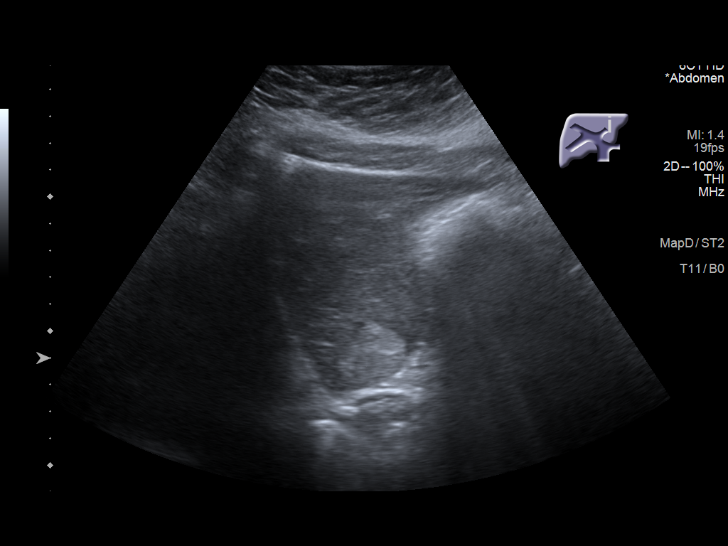
[im 10/30]
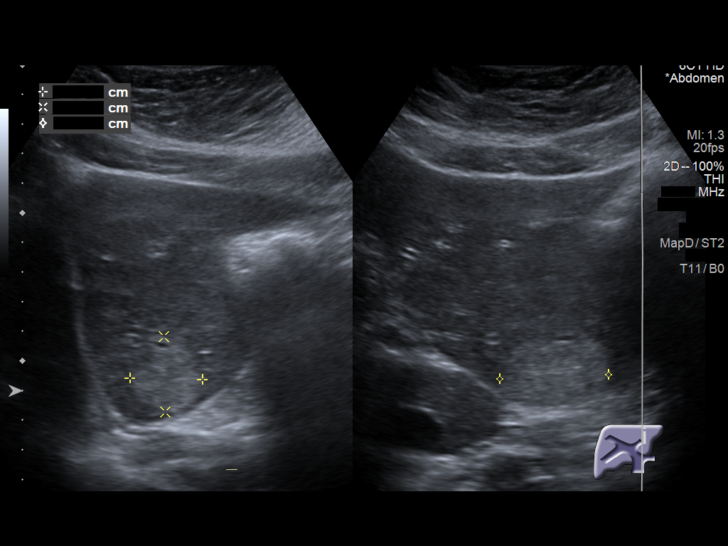
[im 13/30]
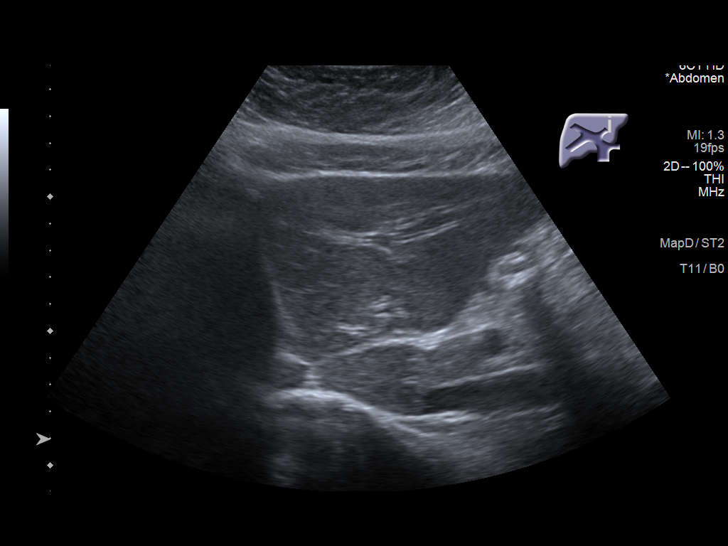
[im 15/30]
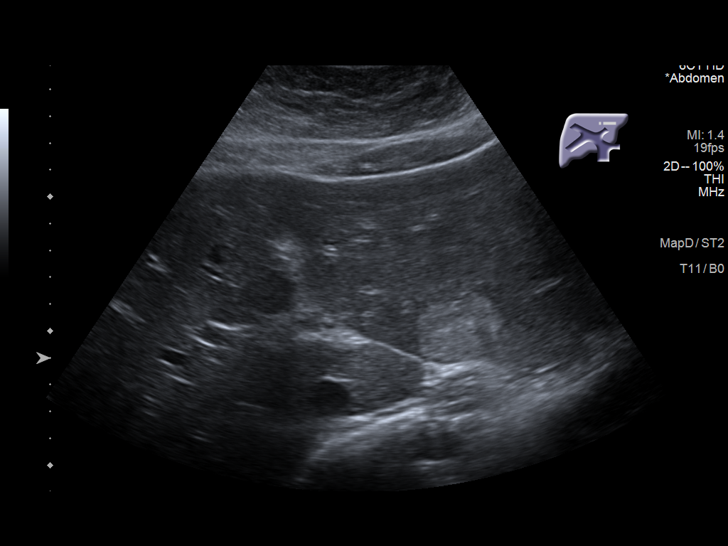
[im 17/30]
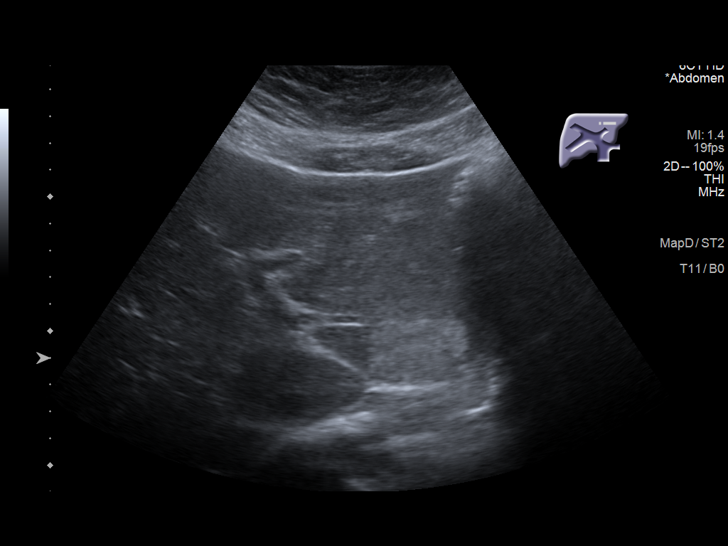
[im 20/30]
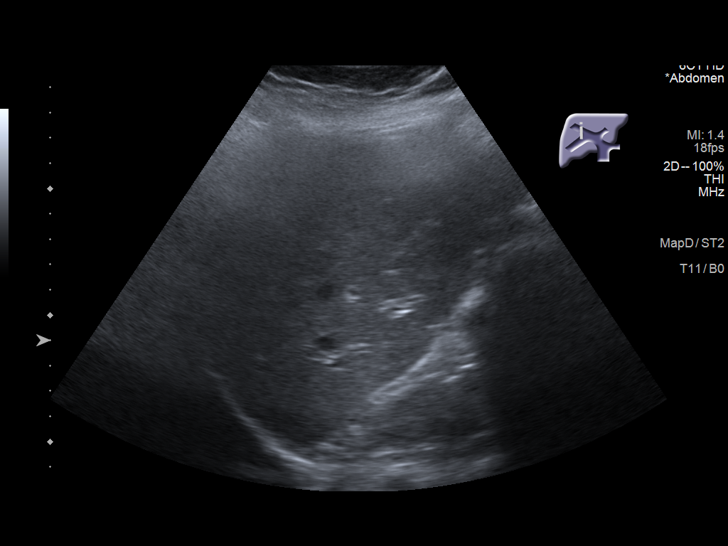
[im 22/30]
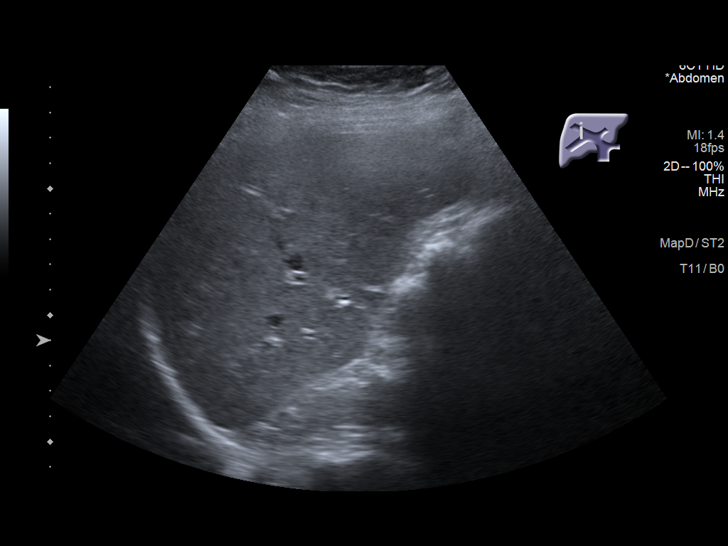
[im 25/30]
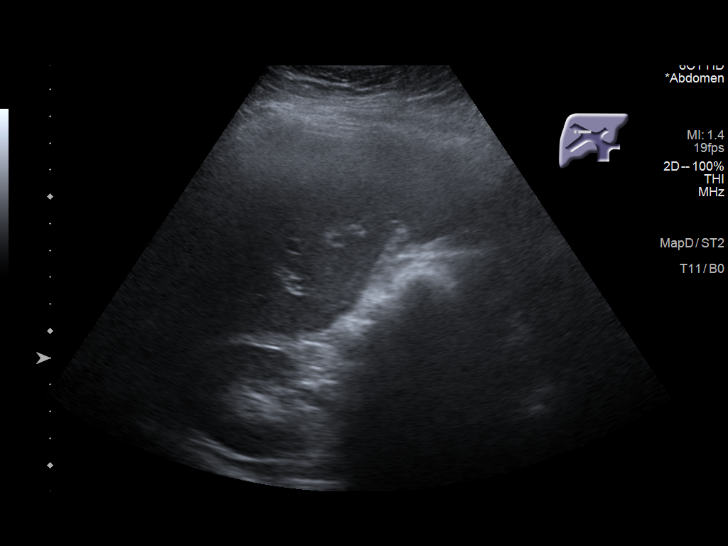
[im 27/30]
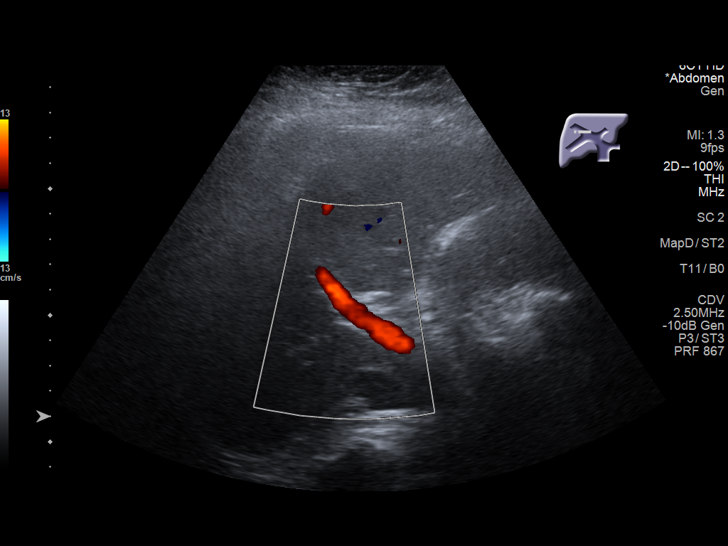
[im 30/30]
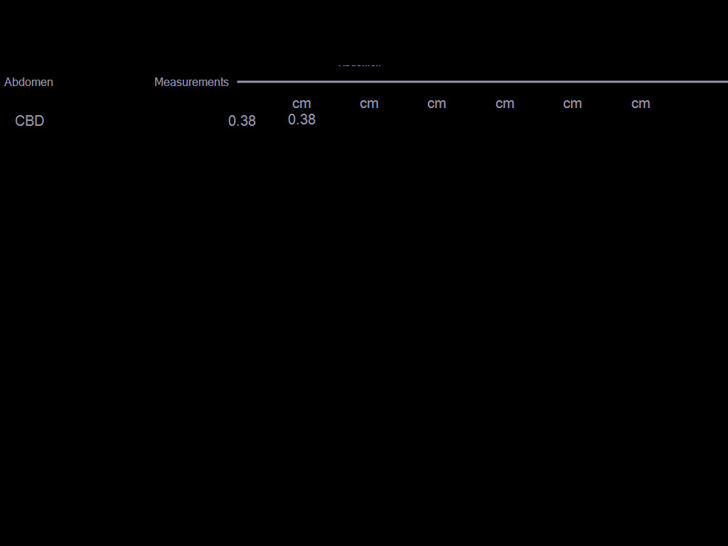

[13 of 25 positions shown; findings below may reference images not displayed]

FINDINGS: Gallbladder:

Cholecystectomy.

Common bile duct:

Diameter: 3.8 mm

Liver:

A 2.5 x 2.5 x 3.7 cm hyperechoic well-circumscribed mass is noted in
the left hepatic lobe. Similar findings noted on prior exam. This
suggests that this lesion is a hemangioma. Again hepatic adenoma and
focal nodular hyperplasia could present in this fashion. Given
stability cavernous hemangioma is again favored. Normal hepatic
parenchymal echogenicity. Portal vein is patent on color Doppler
imaging with normal direction of blood flow towards the liver.

Other: None.
IMPRESSION: 1. A 2.5 x 2.5 x 3.7 cm hyperechoic well-circumscribed masses in the
left hepatic lobe. Similar findings noted on prior exam. This
suggests that this lesion is a benign hemangioma. As noted on prior
study hepatic adenoma and focal nodular hyperplasia could present in
this fashion. Again given stability cavernous hemangiomas favored.
Given the patient's pregnancy and as noted on prior report
noncontrast MRI of the abdomen should be considered for further
evaluation.

2.  Prior cholecystectomy.  No biliary distention.

## 2021-08-25 ENCOUNTER — Other Ambulatory Visit: Payer: Self-pay | Admitting: Nurse Practitioner

## 2021-08-25 DIAGNOSIS — R0600 Dyspnea, unspecified: Secondary | ICD-10-CM

## 2021-08-25 DIAGNOSIS — J45909 Unspecified asthma, uncomplicated: Secondary | ICD-10-CM

## 2021-08-25 DIAGNOSIS — R509 Fever, unspecified: Secondary | ICD-10-CM

## 2022-08-09 DIAGNOSIS — F32A Depression, unspecified: Secondary | ICD-10-CM | POA: Insufficient documentation

## 2022-10-04 ENCOUNTER — Ambulatory Visit
Admission: RE | Admit: 2022-10-04 | Discharge: 2022-10-04 | Disposition: A | Discharge status: Home / Self Care | Payer: BC Managed Care – PPO | Source: Ambulatory Visit | Attending: Urgent Care | Admitting: Urgent Care

## 2022-10-04 VITALS — BP 143/85 | HR 106 | Temp 98.1°F | Resp 16

## 2022-10-04 DIAGNOSIS — J019 Acute sinusitis, unspecified: Secondary | ICD-10-CM

## 2022-10-04 DIAGNOSIS — B9689 Other specified bacterial agents as the cause of diseases classified elsewhere: Secondary | ICD-10-CM | POA: Diagnosis not present

## 2022-10-04 MED ORDER — AMOXICILLIN-POT CLAVULANATE 875-125 MG PO TABS: 1.0000 | ORAL_TABLET | Freq: Two times a day (BID) | ORAL | 0 refills | Status: AC

## 2022-10-04 MED ORDER — PREDNISONE 50 MG PO TABS: 50.0000 mg | ORAL_TABLET | Freq: Every day | ORAL | 0 refills | Status: AC

## 2022-10-04 NOTE — Discharge Instructions (Addendum)
Follow up here or with your primary care provider if your symptoms are worsening or not improving with treatment.     

## 2022-10-04 NOTE — ED Triage Notes (Signed)
Pt. Presents to UC w/ c/o chest congestion, ear drainage, fatigue, lightheadedness, headache, and numbness in her left shoulder when she wakes up in the morning. Pt. Also states she has been "shaky" and almost passed out this morning.
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
## 2022-10-24 ENCOUNTER — Other Ambulatory Visit: Payer: Self-pay | Admitting: Orthopedic Surgery

## 2022-10-25 ENCOUNTER — Encounter: Payer: Self-pay | Admitting: Orthopedic Surgery

## 2022-10-25 ENCOUNTER — Encounter: Payer: Self-pay | Admitting: Anesthesiology

## 2022-10-25 HISTORY — PX: GANGLION CYST EXCISION: SHX1691

## 2022-11-06 ENCOUNTER — Ambulatory Visit: Admit: 2022-11-06 | Payer: BC Managed Care – PPO | Admitting: Orthopedic Surgery

## 2022-11-06 HISTORY — DX: Migraine, unspecified, not intractable, without status migrainosus: G43.909

## 2022-11-06 SURGERY — EXCISION, GANGLION CYST, WRIST
Anesthesia: Choice | Site: Wrist | Laterality: Right

## 2022-11-17 ENCOUNTER — Ambulatory Visit
Admission: RE | Admit: 2022-11-17 | Discharge: 2022-11-17 | Disposition: A | Discharge status: Home / Self Care | Payer: BC Managed Care – PPO | Source: Ambulatory Visit | Attending: Family Medicine | Admitting: Family Medicine

## 2022-11-17 VITALS — BP 129/86 | HR 69 | Temp 98.6°F | Ht 66.0 in | Wt 287.0 lb

## 2022-11-17 DIAGNOSIS — J020 Streptococcal pharyngitis: Secondary | ICD-10-CM | POA: Diagnosis present

## 2022-11-17 DIAGNOSIS — J039 Acute tonsillitis, unspecified: Secondary | ICD-10-CM

## 2022-11-17 DIAGNOSIS — Z20822 Contact with and (suspected) exposure to covid-19: Secondary | ICD-10-CM | POA: Diagnosis present

## 2022-11-17 LAB — GROUP A STREP BY PCR: Group A Strep by PCR: DETECTED — AB

## 2022-11-17 LAB — RESP PANEL BY RT-PCR (RSV, FLU A&B, COVID)  RVPGX2
Influenza A by PCR: NEGATIVE
Influenza B by PCR: NEGATIVE
Resp Syncytial Virus by PCR: NEGATIVE
SARS Coronavirus 2 by RT PCR: NEGATIVE

## 2022-11-17 MED ORDER — CEFDINIR 300 MG PO CAPS: 300.0000 mg | ORAL_CAPSULE | Freq: Two times a day (BID) | ORAL | 0 refills | Status: AC

## 2022-11-17 MED ORDER — PENICILLIN G BENZATHINE 1200000 UNIT/2ML IM SUSY
2.4000 10*6.[IU] | PREFILLED_SYRINGE | Freq: Once | INTRAMUSCULAR | Status: AC
  Administered 2022-11-17: 2.4 10*6.[IU] via INTRAMUSCULAR

## 2022-11-17 NOTE — ED Triage Notes (Signed)
Pt c/o sore throat onset x2 days ago, fever yesterday, headache, chills

## 2022-11-17 NOTE — Discharge Instructions (Addendum)
Stop by the pharmacy to pick up your prescriptions.  Follow up with your primary care provider as needed. Speak your doctor about seeing an ear nose and throat doctor.
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
## 2023-01-16 ENCOUNTER — Ambulatory Visit (INDEPENDENT_AMBULATORY_CARE_PROVIDER_SITE_OTHER): Payer: BLUE CROSS/BLUE SHIELD | Admitting: Internal Medicine

## 2023-01-16 VITALS — BP 125/83 | HR 90 | Resp 18 | Ht 62.0 in | Wt 287.0 lb

## 2023-01-16 DIAGNOSIS — G4733 Obstructive sleep apnea (adult) (pediatric): Secondary | ICD-10-CM
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
## 2023-05-10 ENCOUNTER — Ambulatory Visit
Admission: RE | Admit: 2023-05-10 | Discharge: 2023-05-10 | Disposition: A | Discharge status: Home / Self Care | Payer: BC Managed Care – PPO | Source: Ambulatory Visit | Attending: Physician Assistant | Admitting: Physician Assistant

## 2023-05-10 ENCOUNTER — Ambulatory Visit: Payer: BC Managed Care – PPO

## 2023-05-10 VITALS — BP 122/82 | HR 87 | Temp 98.7°F | Resp 16 | Ht 62.0 in | Wt 287.0 lb

## 2023-05-10 DIAGNOSIS — R0789 Other chest pain: Secondary | ICD-10-CM | POA: Diagnosis not present

## 2023-05-10 DIAGNOSIS — J45901 Unspecified asthma with (acute) exacerbation: Secondary | ICD-10-CM | POA: Diagnosis not present

## 2023-05-10 DIAGNOSIS — R1013 Epigastric pain: Secondary | ICD-10-CM | POA: Diagnosis not present

## 2023-05-10 DIAGNOSIS — R079 Chest pain, unspecified: Secondary | ICD-10-CM

## 2023-05-10 DIAGNOSIS — K219 Gastro-esophageal reflux disease without esophagitis: Secondary | ICD-10-CM

## 2023-05-10 LAB — SARS CORONAVIRUS 2 BY RT PCR: SARS Coronavirus 2 by RT PCR: NEGATIVE

## 2023-05-10 MED ORDER — PANTOPRAZOLE SODIUM 20 MG PO TBEC: 20.0000 mg | DELAYED_RELEASE_TABLET | Freq: Every day | ORAL | 0 refills | Status: AC
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
## 2023-05-10 NOTE — Discharge Instructions (Signed)
-  Chest x-ray is normal. - COVID test is negative. - EKG is normal. - Your symptoms may be due to acid reflux.  I sent antacids to the pharmacy.  Avoid trigger foods.  Eat smaller meals and drink plenty of fluids. - It sounds a little congested in your upper airway so I would use your nebulizer at home.  Your asthma may also be flaring up and sometimes that can happen if acid reflux is flared up. - If at any point you feel that your chest pain, fatigue, breathing or abdominal pain worsen please go to the ER.

## 2023-05-10 NOTE — ED Triage Notes (Addendum)
Patient reports headache that started yesterday.  Patient reports dizziness, stomach pain, nausea and chest pain that started today.  Patient has asthma.  Patient has nebulizer that she uses at home PRN.

## 2023-05-22 DIAGNOSIS — K219 Gastro-esophageal reflux disease without esophagitis: Secondary | ICD-10-CM | POA: Insufficient documentation

## 2023-05-22 DIAGNOSIS — J45909 Unspecified asthma, uncomplicated: Secondary | ICD-10-CM | POA: Insufficient documentation

## 2023-05-22 DIAGNOSIS — R7303 Prediabetes: Secondary | ICD-10-CM | POA: Insufficient documentation

## 2023-06-25 DIAGNOSIS — Z9884 Bariatric surgery status: Secondary | ICD-10-CM | POA: Insufficient documentation

## 2024-02-19 DIAGNOSIS — D508 Other iron deficiency anemias: Secondary | ICD-10-CM | POA: Insufficient documentation

## 2024-07-17 ENCOUNTER — Encounter: Payer: Self-pay | Admitting: Emergency Medicine

## 2024-07-17 ENCOUNTER — Ambulatory Visit
Admission: EM | Admit: 2024-07-17 | Discharge: 2024-07-17 | Disposition: A | Discharge status: Home / Self Care | Attending: Family Medicine | Admitting: Family Medicine

## 2024-07-17 DIAGNOSIS — J069 Acute upper respiratory infection, unspecified: Secondary | ICD-10-CM | POA: Diagnosis not present

## 2024-07-17 DIAGNOSIS — J4521 Mild intermittent asthma with (acute) exacerbation: Secondary | ICD-10-CM | POA: Diagnosis not present

## 2024-07-17 DIAGNOSIS — H6993 Unspecified Eustachian tube disorder, bilateral: Secondary | ICD-10-CM

## 2024-07-17 MED ORDER — ALBUTEROL SULFATE HFA 108 (90 BASE) MCG/ACT IN AERS: 1.0000 | INHALATION_SPRAY | Freq: Four times a day (QID) | RESPIRATORY_TRACT | 0 refills | Status: AC | PRN

## 2024-07-17 MED ORDER — PREDNISONE 20 MG PO TABS: 40.0000 mg | ORAL_TABLET | Freq: Every day | ORAL | 0 refills | Status: AC

## 2024-07-17 MED ORDER — FLUTICASONE PROPIONATE 50 MCG/ACT NA SUSP: 1.0000 | Freq: Every day | NASAL | 0 refills | Status: AC

## 2024-07-17 NOTE — Discharge Instructions (Addendum)
 I have refilled your albuterol inhaler to use as needed for wheezing or shortness of breath.  Start Flonase and prednisone  daily.  Lots of rest and fluids.  Please follow-up with your PCP if your symptoms do not improve.  Please go to the ER if you develop any worsening symptoms.  Hope you feel better soon!
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
## 2024-07-17 NOTE — ED Triage Notes (Signed)
 Pt presents c/o R otalgia x 1 day. Pt states,  I need an inhaler. It's been over two years since I used one and I need it now. I also have right ear pain and the right side of my face feels stuffy.   Pt denies sore throat but does c/o dry cough.
